# Patient Record
Sex: Female | Born: 1935 | Race: White | Hispanic: No | Smoking: Former smoker
Health system: Southern US, Community
[De-identification: ages and names within clinical notes are randomized; demographics above are authoritative.]

## PROBLEM LIST (undated history)

## (undated) DIAGNOSIS — I10 Essential (primary) hypertension: Secondary | ICD-10-CM

## (undated) DIAGNOSIS — F0280 Dementia in other diseases classified elsewhere without behavioral disturbance: Secondary | ICD-10-CM

## (undated) DIAGNOSIS — G309 Alzheimer's disease, unspecified: Secondary | ICD-10-CM

## (undated) DIAGNOSIS — J449 Chronic obstructive pulmonary disease, unspecified: Secondary | ICD-10-CM

---

## 2011-02-15 DEATH — deceased

## 2017-07-12 ENCOUNTER — Other Ambulatory Visit: Payer: Self-pay

## 2017-07-12 ENCOUNTER — Emergency Department (HOSPITAL_COMMUNITY): Payer: Medicare Other

## 2017-07-12 ENCOUNTER — Encounter (HOSPITAL_COMMUNITY): Payer: Self-pay

## 2017-07-12 ENCOUNTER — Emergency Department (HOSPITAL_COMMUNITY)
Admission: EM | Admit: 2017-07-12 | Discharge: 2017-07-12 | Disposition: A | Discharge status: Skilled Nursing Facility | Payer: Medicare Other | Attending: Emergency Medicine | Admitting: Emergency Medicine

## 2017-07-12 DIAGNOSIS — J449 Chronic obstructive pulmonary disease, unspecified: Secondary | ICD-10-CM | POA: Insufficient documentation

## 2017-07-12 DIAGNOSIS — R001 Bradycardia, unspecified: Secondary | ICD-10-CM | POA: Diagnosis not present

## 2017-07-12 DIAGNOSIS — G309 Alzheimer's disease, unspecified: Secondary | ICD-10-CM | POA: Diagnosis not present

## 2017-07-12 DIAGNOSIS — Z87891 Personal history of nicotine dependence: Secondary | ICD-10-CM | POA: Insufficient documentation

## 2017-07-12 DIAGNOSIS — R05 Cough: Secondary | ICD-10-CM | POA: Diagnosis present

## 2017-07-12 DIAGNOSIS — I10 Essential (primary) hypertension: Secondary | ICD-10-CM | POA: Insufficient documentation

## 2017-07-12 DIAGNOSIS — R630 Anorexia: Secondary | ICD-10-CM | POA: Insufficient documentation

## 2017-07-12 HISTORY — DX: Dementia in other diseases classified elsewhere, unspecified severity, without behavioral disturbance, psychotic disturbance, mood disturbance, and anxiety: F02.80

## 2017-07-12 HISTORY — DX: Chronic obstructive pulmonary disease, unspecified: J44.9

## 2017-07-12 HISTORY — DX: Alzheimer's disease, unspecified: G30.9

## 2017-07-12 HISTORY — DX: Essential (primary) hypertension: I10

## 2017-07-12 LAB — URINALYSIS, ROUTINE W REFLEX MICROSCOPIC
Bacteria, UA: NONE SEEN
Bilirubin Urine: NEGATIVE
GLUCOSE, UA: NEGATIVE mg/dL
HGB URINE DIPSTICK: NEGATIVE
KETONES UR: NEGATIVE mg/dL
Leukocytes, UA: NEGATIVE
Nitrite: NEGATIVE
PROTEIN: 100 mg/dL — AB
Specific Gravity, Urine: 1.021 (ref 1.005–1.030)
pH: 6 (ref 5.0–8.0)

## 2017-07-12 LAB — COMPREHENSIVE METABOLIC PANEL
ALT: 9 U/L — AB (ref 14–54)
AST: 26 U/L (ref 15–41)
Albumin: 3.3 g/dL — ABNORMAL LOW (ref 3.5–5.0)
Alkaline Phosphatase: 48 U/L (ref 38–126)
Anion gap: 10 (ref 5–15)
BUN: 46 mg/dL — AB (ref 6–20)
CHLORIDE: 102 mmol/L (ref 101–111)
CO2: 31 mmol/L (ref 22–32)
CREATININE: 1.7 mg/dL — AB (ref 0.44–1.00)
Calcium: 9.7 mg/dL (ref 8.9–10.3)
GFR calc Af Amer: 29 mL/min — ABNORMAL LOW (ref >60)
GFR calc non Af Amer: 25 mL/min — ABNORMAL LOW (ref >60)
GLUCOSE: 124 mg/dL — AB (ref 65–99)
POTASSIUM: 4 mmol/L (ref 3.5–5.1)
SODIUM: 143 mmol/L (ref 135–145)
Total Bilirubin: 0.6 mg/dL (ref 0.3–1.2)
Total Protein: 6.4 g/dL — ABNORMAL LOW (ref 6.5–8.1)

## 2017-07-12 LAB — CBC WITH DIFFERENTIAL/PLATELET
BASOS ABS: 0 10*3/uL (ref 0.0–0.1)
BASOS PCT: 0 %
EOS ABS: 0.2 10*3/uL (ref 0.0–0.7)
EOS PCT: 2 %
HCT: 40.6 % (ref 36.0–46.0)
Hemoglobin: 13.2 g/dL (ref 12.0–15.0)
LYMPHS PCT: 18 %
Lymphs Abs: 2.3 10*3/uL (ref 0.7–4.0)
MCH: 29.9 pg (ref 26.0–34.0)
MCHC: 32.5 g/dL (ref 30.0–36.0)
MCV: 92.1 fL (ref 78.0–100.0)
Monocytes Absolute: 1.3 10*3/uL — ABNORMAL HIGH (ref 0.1–1.0)
Monocytes Relative: 10 %
Neutro Abs: 9.2 10*3/uL — ABNORMAL HIGH (ref 1.7–7.7)
Neutrophils Relative %: 70 %
PLATELETS: 265 10*3/uL (ref 150–400)
RBC: 4.41 MIL/uL (ref 3.87–5.11)
RDW: 13.6 % (ref 11.5–15.5)
WBC: 13.1 10*3/uL — AB (ref 4.0–10.5)

## 2017-07-12 LAB — I-STAT TROPONIN, ED: TROPONIN I, POC: 0 ng/mL (ref 0.00–0.08)

## 2017-07-12 MED ORDER — SODIUM CHLORIDE 0.9 % IV BOLUS
500.0000 mL | Freq: Once | INTRAVENOUS | Status: AC
  Administered 2017-07-12: 500 mL via INTRAVENOUS

## 2017-07-12 NOTE — ED Notes (Signed)
Bed: MB86 Expected date:  Expected time:  Means of arrival:  Comments: 84 yo poor PO intake x2 days

## 2017-07-12 NOTE — ED Triage Notes (Signed)
Per EMS- Patient is a resident of [facility] on [address] . Staff reported that the patient has had a decreased appetite x 2 days. Staff reported that the patient has ate only 30-40% when she normally eats 80%.

## 2017-07-12 NOTE — ED Provider Notes (Signed)
[facility] DEPT Provider Note   CSN: [id number] Arrival date & time: 07/12/17  1032     History   Chief Complaint Chief Complaint  Patient presents with  . decreased appetite  . Cough    HPI Priscilla Ramirez is a 84 y.o. female.  Patient is a 84 year old female with a history of hypertension, COPD and dementia who lives at assisted living in presenting today due to increased decreased appetite.  Granddaughter is present with the patient and states last week she had a fever and flulike illness and completed 5-day course of Tamiflu and a 7-day course of Levaquin.  Levaquin finished 4 days ago.  Patient has not had a persistent wet sounding cough that does not produce any sputum and yesterday and today noted to have a decreased appetite.  Patient has only 820-30% of her meal when she normally eats 80%.  When questioning the patient she states she did not care for the food that they served.  She is denying chest pain, shortness of breath, abdominal pain, nausea, vomiting or fever currently.  The history is provided by the patient.  Cough  This is a new problem. Episode onset: 1 week. The problem occurs constantly. The problem has not changed since onset.The cough is non-productive. Maximum temperature: 1 day of fever 1 week ago but no presistent fever. Pertinent negatives include no chest pain, no chills, no headaches, no shortness of breath and no wheezing.    Past Medical History:  Diagnosis Date  . Alzheimer's dementia   . COPD (chronic obstructive pulmonary disease) ([place])   . Hypertension     There are no active problems to display for this patient.   History reviewed. No pertinent surgical history.   OB History   None      Home Medications    Prior to Admission medications   Not on File    Family History No family history on file.  Social History Social History   Tobacco Use  . Smoking status: Former Smoker    Types: Cigarettes    . Smokeless tobacco: Never Used  Substance Use Topics  . Alcohol use: Never    Frequency: Never  . Drug use: Not on file     Allergies   Patient has no known allergies.   Review of Systems Review of Systems  Constitutional: Negative for chills.  Respiratory: Positive for cough. Negative for shortness of breath and wheezing.   Cardiovascular: Negative for chest pain.  Neurological: Negative for headaches.  All other systems reviewed and are negative.    Physical Exam Updated Vital Signs BP (!) 164/56 (BP Location: Left Arm)   Pulse (!) 55   Temp 97.9 F (36.6 C) (Oral)   Resp 16   Ht 5' (1.524 m)   Wt 56.7 kg (125 lb)   SpO2 96%   BMI 24.41 kg/m   Physical Exam  Constitutional: She is oriented to person, place, and time. She appears well-developed and well-nourished. No distress.  HENT:  Head: Normocephalic and atraumatic.  Mouth/Throat: Oropharynx is clear and moist. Mucous membranes are dry.  Eyes: Pupils are equal, round, and reactive to light. Conjunctivae and EOM are normal.  Neck: Normal range of motion. Neck supple.  Cardiovascular: Normal rate, regular rhythm and intact distal pulses.  No murmur heard. Pulmonary/Chest: Effort normal. No respiratory distress. She has no wheezes. She has rhonchi in the left lower field. She has no rales.  Cough that is nonproductive  Abdominal: Soft.  She exhibits no distension. There is no tenderness. There is no rebound and no guarding.  Musculoskeletal: Normal range of motion. She exhibits no edema or tenderness.  Neurological: She is alert and oriented to person, place, and time.  Skin: Skin is warm and dry. No rash noted. No erythema.  Psychiatric: She has a normal mood and affect. Her behavior is normal.  Nursing note and vitals reviewed.    ED Treatments / Results  Labs (all labs ordered are listed, but only abnormal results are displayed) Labs Reviewed  CBC WITH DIFFERENTIAL/PLATELET - Abnormal; Notable for the  following components:      Result Value   WBC 13.1 (*)    Neutro Abs 9.2 (*)    Monocytes Absolute 1.3 (*)    All other components within normal limits  COMPREHENSIVE METABOLIC PANEL - Abnormal; Notable for the following components:   Glucose, Bld 124 (*)    BUN 46 (*)    Creatinine, Ser 1.70 (*)    Total Protein 6.4 (*)    Albumin 3.3 (*)    ALT 9 (*)    GFR calc non Af Amer 25 (*)    GFR calc Af Amer 29 (*)    All other components within normal limits  URINALYSIS, ROUTINE W REFLEX MICROSCOPIC - Abnormal; Notable for the following components:   Protein, ur 100 (*)    Squamous Epithelial / LPF 0-5 (*)    All other components within normal limits  I-STAT TROPONIN, ED    EKG EKG Interpretation  Date/Time:  Friday July 12 2017 14:03:56 EDT Ventricular Rate:  66 PR Interval:    QRS Duration: 130 QT Interval:  514 QTC Calculation: 539 R Axis:   -63 Text Interpretation:  Sinus rhythm RBBB and LAFB No previous tracing Confirmed by Blanchie Dessert [phone]) on 07/12/2017 2:17:02 PM   Radiology Dg Chest 2 View  Result Date: 07/12/2017 CLINICAL DATA:  Cough EXAM: CHEST - 2 VIEW COMPARISON:  None. FINDINGS: Normal heart size. Aortic atherosclerosis. Normal mediastinal contour. No pneumothorax. No pleural effusion. No pulmonary edema. No acute consolidative airspace disease. Minimal scarring versus atelectasis at the left lung base. IMPRESSION: Minimal scarring versus atelectasis at the left lung base. Otherwise no active cardiopulmonary disease. Electronically Signed   By: Ilona Sorrel M.D.   On: 07/12/2017 12:05    Procedures Procedures (including critical care time)  Medications Ordered in ED Medications  sodium chloride 0.9 % bolus 500 mL (0 mLs Intravenous Stopped 07/12/17 1355)     Initial Impression / Assessment and Plan / ED Course  I have reviewed the triage vital signs and the nursing notes.  Pertinent labs & imaging results that were available during my care of the  patient were reviewed by me and considered in my medical decision making (see chart for details).     Elderly female presenting today with decreased appetite for the last 2 days.  She was recently treated with Tamiflu and Levaquin last week for cough and flulike symptoms.  Per family member the cough has persisted the patient has no complaints.  She denies feeling short of breath and states that she just did not like what they served and that is why she did not eat.  Patient's vital signs are reassuring.  She does have some mild rhonchi in the left lower lobe in the setting of cough we will do an x-ray to ensure no evidence of pneumonia.  Lower suspicion for atypical ACS.  Will do EKG to  ensure no cardiac issues.  Also will check a CBC, BMP to ensure no electrolyte or renal dysfunction.  Patient given 500 mL's of fluid.  4:49 PM Labs with mild leukocytosis of 13,000, negative troponin, EKG without acute findings, CMP with possible mild renal insufficiency with a creatinine of 1.7 and a GFR of 25 liver this may be chronic as there are no old to compare.  Chest x-ray without acute findings and UA without sign of infection.  Patient is otherwise been stable.  She does have episodes of bradycardia however these are asymptomatic.  Patient and family states this is been going on for years.  Recommended following up with cardiologist in the future.  However at this time feel she is safe for discharge home.  Final Clinical Impressions(s) / ED Diagnoses   Final diagnoses:  Poor appetite  Bradycardia    ED Discharge Orders    None       Blanchie Dessert, MD 07/12/17 1650

## 2017-07-12 NOTE — ED Notes (Signed)
Purewick removed.

## 2017-07-12 NOTE — ED Notes (Signed)
WICK PUT IN PLACE FOR URINE COLLECTION

## 2017-07-12 NOTE — ED Notes (Signed)
Signature pad not working. 

## 2017-07-16 ENCOUNTER — Encounter (HOSPITAL_COMMUNITY): Payer: Self-pay

## 2017-07-16 ENCOUNTER — Inpatient Hospital Stay (HOSPITAL_COMMUNITY)
Admission: EM | Admit: 2017-07-16 | Discharge: 2017-07-19 | DRG: 545 | Disposition: A | Discharge status: Skilled Nursing Facility | Payer: Medicare Other | Attending: Internal Medicine | Admitting: Internal Medicine

## 2017-07-16 ENCOUNTER — Emergency Department (HOSPITAL_COMMUNITY): Payer: Medicare Other

## 2017-07-16 ENCOUNTER — Other Ambulatory Visit: Payer: Self-pay

## 2017-07-16 ENCOUNTER — Emergency Department (HOSPITAL_COMMUNITY)
Admit: 2017-07-16 | Discharge: 2017-07-16 | Disposition: A | Discharge status: Home / Self Care | Payer: Medicare Other | Attending: Emergency Medicine | Admitting: Emergency Medicine

## 2017-07-16 DIAGNOSIS — R531 Weakness: Secondary | ICD-10-CM

## 2017-07-16 DIAGNOSIS — N184 Chronic kidney disease, stage 4 (severe): Secondary | ICD-10-CM | POA: Diagnosis not present

## 2017-07-16 DIAGNOSIS — Z66 Do not resuscitate: Secondary | ICD-10-CM | POA: Diagnosis present

## 2017-07-16 DIAGNOSIS — N183 Chronic kidney disease, stage 3 unspecified: Secondary | ICD-10-CM | POA: Diagnosis present

## 2017-07-16 DIAGNOSIS — I10 Essential (primary) hypertension: Secondary | ICD-10-CM

## 2017-07-16 DIAGNOSIS — M7989 Other specified soft tissue disorders: Secondary | ICD-10-CM | POA: Diagnosis not present

## 2017-07-16 DIAGNOSIS — J9601 Acute respiratory failure with hypoxia: Secondary | ICD-10-CM | POA: Diagnosis present

## 2017-07-16 DIAGNOSIS — M02361 Reiter's disease, right knee: Secondary | ICD-10-CM | POA: Diagnosis present

## 2017-07-16 DIAGNOSIS — M79609 Pain in unspecified limb: Secondary | ICD-10-CM

## 2017-07-16 DIAGNOSIS — E860 Dehydration: Secondary | ICD-10-CM | POA: Diagnosis present

## 2017-07-16 DIAGNOSIS — M25561 Pain in right knee: Secondary | ICD-10-CM

## 2017-07-16 DIAGNOSIS — T447X5A Adverse effect of beta-adrenoreceptor antagonists, initial encounter: Secondary | ICD-10-CM | POA: Diagnosis present

## 2017-07-16 DIAGNOSIS — G309 Alzheimer's disease, unspecified: Secondary | ICD-10-CM | POA: Diagnosis present

## 2017-07-16 DIAGNOSIS — R5381 Other malaise: Secondary | ICD-10-CM

## 2017-07-16 DIAGNOSIS — R001 Bradycardia, unspecified: Secondary | ICD-10-CM | POA: Diagnosis present

## 2017-07-16 DIAGNOSIS — Z87891 Personal history of nicotine dependence: Secondary | ICD-10-CM | POA: Diagnosis not present

## 2017-07-16 DIAGNOSIS — M109 Gout, unspecified: Secondary | ICD-10-CM | POA: Diagnosis present

## 2017-07-16 DIAGNOSIS — I4581 Long QT syndrome: Secondary | ICD-10-CM | POA: Diagnosis present

## 2017-07-16 DIAGNOSIS — Z22322 Carrier or suspected carrier of Methicillin resistant Staphylococcus aureus: Secondary | ICD-10-CM | POA: Diagnosis not present

## 2017-07-16 DIAGNOSIS — F419 Anxiety disorder, unspecified: Secondary | ICD-10-CM | POA: Diagnosis present

## 2017-07-16 DIAGNOSIS — M25461 Effusion, right knee: Secondary | ICD-10-CM

## 2017-07-16 DIAGNOSIS — N179 Acute kidney failure, unspecified: Secondary | ICD-10-CM | POA: Diagnosis not present

## 2017-07-16 DIAGNOSIS — T441X5A Adverse effect of other parasympathomimetics [cholinergics], initial encounter: Secondary | ICD-10-CM | POA: Diagnosis present

## 2017-07-16 DIAGNOSIS — F0280 Dementia in other diseases classified elsewhere without behavioral disturbance: Secondary | ICD-10-CM

## 2017-07-16 DIAGNOSIS — I4510 Unspecified right bundle-branch block: Secondary | ICD-10-CM | POA: Diagnosis present

## 2017-07-16 DIAGNOSIS — J449 Chronic obstructive pulmonary disease, unspecified: Secondary | ICD-10-CM | POA: Diagnosis not present

## 2017-07-16 DIAGNOSIS — F0390 Unspecified dementia without behavioral disturbance: Secondary | ICD-10-CM

## 2017-07-16 DIAGNOSIS — I129 Hypertensive chronic kidney disease with stage 1 through stage 4 chronic kidney disease, or unspecified chronic kidney disease: Secondary | ICD-10-CM | POA: Diagnosis present

## 2017-07-16 DIAGNOSIS — D72829 Elevated white blood cell count, unspecified: Secondary | ICD-10-CM | POA: Insufficient documentation

## 2017-07-16 LAB — COMPREHENSIVE METABOLIC PANEL
ALBUMIN: 3.1 g/dL — AB (ref 3.5–5.0)
ALT: 18 U/L (ref 14–54)
AST: 49 U/L — AB (ref 15–41)
Alkaline Phosphatase: 57 U/L (ref 38–126)
Anion gap: 11 (ref 5–15)
BUN: 40 mg/dL — AB (ref 6–20)
CHLORIDE: 105 mmol/L (ref 101–111)
CO2: 26 mmol/L (ref 22–32)
Calcium: 9.4 mg/dL (ref 8.9–10.3)
Creatinine, Ser: 1.61 mg/dL — ABNORMAL HIGH (ref 0.44–1.00)
GFR calc Af Amer: 30 mL/min — ABNORMAL LOW (ref >60)
GFR calc non Af Amer: 26 mL/min — ABNORMAL LOW (ref >60)
GLUCOSE: 106 mg/dL — AB (ref 65–99)
POTASSIUM: 4.4 mmol/L (ref 3.5–5.1)
Sodium: 142 mmol/L (ref 135–145)
Total Bilirubin: 1.1 mg/dL (ref 0.3–1.2)
Total Protein: 6.9 g/dL (ref 6.5–8.1)

## 2017-07-16 LAB — CBC WITH DIFFERENTIAL/PLATELET
Basophils Absolute: 0 10*3/uL (ref 0.0–0.1)
Basophils Relative: 0 %
EOS PCT: 0 %
Eosinophils Absolute: 0.1 10*3/uL (ref 0.0–0.7)
HCT: 41.4 % (ref 36.0–46.0)
Hemoglobin: 13.3 g/dL (ref 12.0–15.0)
LYMPHS ABS: 1.6 10*3/uL (ref 0.7–4.0)
Lymphocytes Relative: 10 %
MCH: 29.5 pg (ref 26.0–34.0)
MCHC: 32.1 g/dL (ref 30.0–36.0)
MCV: 91.8 fL (ref 78.0–100.0)
MONO ABS: 1.2 10*3/uL (ref 0.1–1.0)
MONOS PCT: 8 %
Neutro Abs: 13.2 10*3/uL (ref 1.7–7.7)
Neutrophils Relative %: 82 %
Platelets: 269 10*3/uL (ref 150–400)
RBC: 4.51 MIL/uL (ref 3.87–5.11)
RDW: 14.3 % (ref 11.5–15.5)
WBC: 16.1 10*3/uL — ABNORMAL HIGH (ref 4.0–10.5)

## 2017-07-16 LAB — URINALYSIS, ROUTINE W REFLEX MICROSCOPIC
BACTERIA UA: NONE SEEN
Bilirubin Urine: NEGATIVE
GLUCOSE, UA: NEGATIVE mg/dL
Hgb urine dipstick: NEGATIVE
KETONES UR: NEGATIVE mg/dL
Leukocytes, UA: NEGATIVE
NITRITE: NEGATIVE
PROTEIN: 100 mg/dL — AB
Specific Gravity, Urine: 1.019 (ref 1.005–1.030)
pH: 5 (ref 5.0–8.0)

## 2017-07-16 LAB — I-STAT TROPONIN, ED: Troponin i, poc: 0 ng/mL (ref 0.00–0.08)

## 2017-07-16 MED ORDER — LORATADINE 10 MG PO TABS
10.0000 mg | ORAL_TABLET | Freq: Every day | ORAL | Status: DC
  Administered 2017-07-17 – 2017-07-19 (×3): 10 mg via ORAL
  Filled 2017-07-16 (×4): qty 1

## 2017-07-16 MED ORDER — OXYCODONE-ACETAMINOPHEN 5-325 MG PO TABS: 1.0000 | ORAL_TABLET | ORAL | Status: DC | PRN

## 2017-07-16 MED ORDER — ACETAMINOPHEN 325 MG PO TABS: 650.0000 mg | ORAL_TABLET | Freq: Four times a day (QID) | ORAL | Status: DC | PRN

## 2017-07-16 MED ORDER — IPRATROPIUM-ALBUTEROL 0.5-2.5 (3) MG/3ML IN SOLN
3.0000 mL | RESPIRATORY_TRACT | Status: DC
  Administered 2017-07-17: 3 mL via RESPIRATORY_TRACT
  Filled 2017-07-16: qty 3

## 2017-07-16 MED ORDER — ACETAMINOPHEN 650 MG RE SUPP: 650.0000 mg | Freq: Four times a day (QID) | RECTAL | Status: DC | PRN

## 2017-07-16 MED ORDER — SODIUM CHLORIDE 0.9 % IV SOLN
INTRAVENOUS | Status: DC
  Administered 2017-07-16: via INTRAVENOUS
  Administered 2017-07-17: 75 mL/h via INTRAVENOUS
  Administered 2017-07-18 – 2017-07-19 (×2): via INTRAVENOUS

## 2017-07-16 MED ORDER — DONEPEZIL HCL 10 MG PO TABS
10.0000 mg | ORAL_TABLET | Freq: Every day | ORAL | Status: DC
  Administered 2017-07-17 – 2017-07-18 (×2): 10 mg via ORAL
  Filled 2017-07-16 (×3): qty 1

## 2017-07-16 MED ORDER — CALCIUM CARBONATE-VITAMIN D 500-200 MG-UNIT PO TABS
1.0000 | ORAL_TABLET | Freq: Two times a day (BID) | ORAL | Status: DC
  Administered 2017-07-17 – 2017-07-19 (×6): 1 via ORAL
  Filled 2017-07-16 (×5): qty 1

## 2017-07-16 MED ORDER — DM-GUAIFENESIN ER 30-600 MG PO TB12
1.0000 | ORAL_TABLET | Freq: Two times a day (BID) | ORAL | Status: DC | PRN
Start: 2017-07-16 — End: 2017-07-19

## 2017-07-16 MED ORDER — HYDRALAZINE HCL 20 MG/ML IJ SOLN: 5.0000 mg | INTRAMUSCULAR | Status: DC | PRN

## 2017-07-16 MED ORDER — HYDRALAZINE HCL 25 MG PO TABS
25.0000 mg | ORAL_TABLET | Freq: Two times a day (BID) | ORAL | Status: DC
  Administered 2017-07-17 – 2017-07-19 (×6): 25 mg via ORAL
  Filled 2017-07-16 (×6): qty 1

## 2017-07-16 MED ORDER — SENNOSIDES-DOCUSATE SODIUM 8.6-50 MG PO TABS: 1.0000 | ORAL_TABLET | Freq: Every evening | ORAL | Status: DC | PRN

## 2017-07-16 MED ORDER — TRAZODONE HCL 50 MG PO TABS
50.0000 mg | ORAL_TABLET | Freq: Every day | ORAL | Status: DC
  Administered 2017-07-17 – 2017-07-18 (×3): 50 mg via ORAL
  Filled 2017-07-16 (×3): qty 1

## 2017-07-16 MED ORDER — VANCOMYCIN HCL IN DEXTROSE 750-5 MG/150ML-% IV SOLN
750.0000 mg | Freq: Once | INTRAVENOUS | Status: AC
  Administered 2017-07-17: 750 mg via INTRAVENOUS
  Filled 2017-07-16: qty 150

## 2017-07-16 MED ORDER — LORAZEPAM 0.5 MG PO TABS
0.5000 mg | ORAL_TABLET | Freq: Two times a day (BID) | ORAL | Status: DC
  Administered 2017-07-17 – 2017-07-19 (×6): 0.5 mg via ORAL
  Filled 2017-07-16 (×6): qty 1

## 2017-07-16 MED ORDER — HYDROXYZINE HCL 10 MG PO TABS
10.0000 mg | ORAL_TABLET | Freq: Three times a day (TID) | ORAL | Status: DC | PRN
  Filled 2017-07-16: qty 1

## 2017-07-16 MED ORDER — ALBUTEROL SULFATE (2.5 MG/3ML) 0.083% IN NEBU
2.5000 mg | INHALATION_SOLUTION | RESPIRATORY_TRACT | Status: DC | PRN
  Administered 2017-07-19: 2.5 mg via RESPIRATORY_TRACT
  Filled 2017-07-16: qty 3

## 2017-07-16 MED ORDER — SODIUM CHLORIDE 0.9 % IV BOLUS
1000.0000 mL | Freq: Once | INTRAVENOUS | Status: AC
  Administered 2017-07-16: 1000 mL via INTRAVENOUS

## 2017-07-16 NOTE — Progress Notes (Signed)
Right lower extremity venous duplex completed. There is no evidence of a DVT,superficial thrombosis, or Baker's cyst, There is a large area on fliud which is not vascularized in the popliteal fossa.

## 2017-07-16 NOTE — ED Triage Notes (Signed)
Pt arrived via [facility], per EMS, pt from [facility]; called out for increased wkndess  And bilateral lower extremity edema. EMS noted rapid rate change from 70-32; Atropine 0.5mg  administered and rate increased to 40s, pt has hx of dementia; 18RFA; 176/58

## 2017-07-16 NOTE — ED Notes (Signed)
Patient transported to CT 

## 2017-07-16 NOTE — ED Provider Notes (Signed)
[facility] EMERGENCY DEPARTMENT Provider Note   CSN: [id number] Arrival date & time: 07/16/17  1621     History   Chief Complaint Chief Complaint  Patient presents with  . Bradycardia    HPI Priscilla Ramirez is a 84 y.o. female with a hx of Alzheimer's, HTN, and COPD who arrives to the ED via EMS from [facility] for RLE swelling and increased generalized weakness and decreased PO intake. - Patient provided hx limited secondary to dementia- consistently answers no to all questions. Only complaint at this time is that she is cold.  - Hx provided by EMS- EMS called to assisted living facility for RLE swelling and generalized weakness/fatigue. Upon arrival patient with initial HR of 70 and abrupt decrease to 32, 0.5mg  Atropine given with improvement of HR into the 40s.  - Hx provided by [facility]- I called and spoke with clinical staff. Patient has had decreased appetite, fatigue, and disinterest in participating in activities over the past 2 week. She was seen in the ED 03/29 for similar and discharged home after receiving IV fluids. Patient has had persistence of these sxs.Additionally she is having RLE pain/swelling over past couple of days. Staff has not noted any complaints of headaches, numbness, focal weakness, chest pain, or dyspnea, she has not appeared short of breath at the nursing home. No recent changes in medications. Patient is baseline oriented to person, disoriented to place/time. She has appeared at her mental baseline per the clinical staff.   Level 5 Caveat Secondary to Dementia at baseline.     HPI  Past Medical History:  Diagnosis Date  . Alzheimer's dementia   . COPD (chronic obstructive pulmonary disease) ([place])   . Hypertension     There are no active problems to display for this patient.   History reviewed. No pertinent surgical history.   OB History   None      Home Medications    Prior to  Admission medications   Medication Sig Start Date End Date Taking? Authorizing Provider  Calcium 600-400 MG-UNIT CHEW Chew 1 tablet by mouth 2 (two) times daily.    [provider]  cetirizine (ZYRTEC) 10 MG tablet Take 10 mg by mouth daily.    [provider]  donepezil (ARICEPT) 10 MG tablet Take 10 mg by mouth at bedtime.    [provider]  furosemide (LASIX) 20 MG tablet Take 20 mg by mouth daily. FOR 3 DAYS    [provider]  guaiFENesin (ROBITUSSIN MUCUS+CHEST CONGEST) 100 MG/5ML liquid Take 400 mg by mouth 4 (four) times daily as needed for cough or congestion.    [provider]  hydrALAZINE (APRESOLINE) 25 MG tablet Take 25 mg by mouth 2 (two) times daily.    [provider]  levofloxacin (LEVAQUIN) 500 MG tablet Take 500 mg by mouth at bedtime. FOR 7 DAYS    [provider]  LORazepam (ATIVAN) 0.5 MG tablet Take 0.5 mg by mouth 2 (two) times daily.    [provider]  metoprolol tartrate (LOPRESSOR) 100 MG tablet Take 100 mg by mouth 2 (two) times daily.    [provider]  oseltamivir (TAMIFLU) 75 MG capsule Take 45 mg by mouth 2 (two) times daily.    [provider]  saccharomyces boulardii (FLORASTOR) 250 MG capsule Take 250 mg by mouth 2 (two) times daily.    [provider]  traZODone (DESYREL) 50 MG tablet Take 50 mg by  mouth at bedtime.    [provider]    Family History History reviewed. No pertinent family history.  Social History Social History   Tobacco Use  . Smoking status: Former Smoker    Types: Cigarettes  . Smokeless tobacco: Never Used  Substance Use Topics  . Alcohol use: Never    Frequency: Never  . Drug use: Not on file     Allergies   Patient has no known allergies.   Review of Systems Review of Systems  Unable to perform ROS: Dementia  Constitutional: Positive for activity change and appetite change.  Cardiovascular: Positive for leg  swelling (RLE).  Musculoskeletal: Positive for myalgias (RLE).  Neurological: Positive for weakness (generalized).    Physical Exam Updated Vital Signs BP (!) 161/47 (BP Location: Left Arm)   Pulse (!) 44   Temp 98.2 F (36.8 C) (Oral)   Resp (!) 21   SpO2 (!) 86%   Physical Exam  Constitutional:  Non-toxic appearance. No distress.  HENT:  Mouth/Throat: Mucous membranes are dry.  Eyes: Pupils are equal, round, and reactive to light.  Cardiovascular: Regular rhythm. Bradycardia present.  No murmur heard. Pulses:      Dorsalis pedis pulses are 2+ on the right side, and 2+ on the left side.  Pulmonary/Chest: Effort normal. No respiratory distress. She has decreased breath sounds (throughout). She has no wheezes.  Abdominal: Soft. There is no tenderness. There is no rigidity and no guarding.  Musculoskeletal:  Lower extremities: Right lower extremity with 2+ pitting edema to the knee.  Patient with limited range of motion of all joints, unclear if this is secondary to pain or weakness.  RLE lower leg tender to palpation. There is no overlying erythema or warmth.   Neurological:  Alert.  Oriented to person.  Disoriented to time and place.   Skin: Skin is warm and dry.   ED Treatments / Results  Labs Results for orders placed or performed during the hospital encounter of 07/16/17  Comprehensive metabolic panel  Result Value Ref Range   Sodium 142 135 - 145 mmol/L   Potassium 4.4 3.5 - 5.1 mmol/L   Chloride 105 101 - 111 mmol/L   CO2 26 22 - 32 mmol/L   Glucose, Bld 106 (H) 65 - 99 mg/dL   BUN 40 (H) 6 - 20 mg/dL   Creatinine, Ser 1.61 (H) 0.44 - 1.00 mg/dL   Calcium 9.4 8.9 - 10.3 mg/dL   Total Protein 6.9 6.5 - 8.1 g/dL   Albumin 3.1 (L) 3.5 - 5.0 g/dL   AST 49 (H) 15 - 41 U/L   ALT 18 14 - 54 U/L   Alkaline Phosphatase 57 38 - 126 U/L   Total Bilirubin 1.1 0.3 - 1.2 mg/dL   GFR calc non Af Amer 26 (L) >60 mL/min   GFR calc Af Amer 30 (L) >60 mL/min   Anion gap 11 5 -  15  CBC with Differential  Result Value Ref Range   WBC 16.1 (H) 4.0 - 10.5 K/uL   RBC 4.51 3.87 - 5.11 MIL/uL   Hemoglobin 13.3 12.0 - 15.0 g/dL   HCT 41.4 36.0 - 46.0 %   MCV 91.8 78.0 - 100.0 fL   MCH 29.5 26.0 - 34.0 pg   MCHC 32.1 30.0 - 36.0 g/dL   RDW 14.3 11.5 - 15.5 %   Platelets 269 150 - 400 K/uL   Neutrophils Relative % 82 %   Neutro Abs 13.2 1.7 - 7.7  K/uL   Lymphocytes Relative 10 %   Lymphs Abs 1.6 0.7 - 4.0 K/uL   Monocytes Relative 8 %   Monocytes Absolute 1.2 0.1 - 1.0 K/uL   Eosinophils Relative 0 %   Eosinophils Absolute 0.1 0.0 - 0.7 K/uL   Basophils Relative 0 %   Basophils Absolute 0.0 0.0 - 0.1 K/uL  Urinalysis, Routine w reflex microscopic  Result Value Ref Range   Color, Urine YELLOW YELLOW   APPearance CLEAR CLEAR   Specific Gravity, Urine 1.019 1.005 - 1.030   pH 5.0 5.0 - 8.0   Glucose, UA NEGATIVE NEGATIVE mg/dL   Hgb urine dipstick NEGATIVE NEGATIVE   Bilirubin Urine NEGATIVE NEGATIVE   Ketones, ur NEGATIVE NEGATIVE mg/dL   Protein, ur 100 (A) NEGATIVE mg/dL   Nitrite NEGATIVE NEGATIVE   Leukocytes, UA NEGATIVE NEGATIVE   RBC / HPF 0-5 0 - 5 RBC/hpf   WBC, UA 0-5 0 - 5 WBC/hpf   Bacteria, UA NONE SEEN NONE SEEN   Squamous Epithelial / LPF 0-5 (A) NONE SEEN   Mucus PRESENT    Hyaline Casts, UA PRESENT   I-stat troponin, ED  Result Value Ref Range   Troponin i, poc 0.00 0.00 - 0.08 ng/mL   Comment 3            EKG EKG Interpretation  Date/Time:  Tuesday July 16 2017 16:29:51 EDT Ventricular Rate:  44 PR Interval:    QRS Duration: 129 QT Interval:  650 QTC Calculation: 557 R Axis:   -60 Text Interpretation:  Sinus bradycardia RBBB and LAFB Confirmed by Milton Ferguson [phone]) on 07/16/2017 9:43:53 PM   Radiology Dg Pelvis 1-2 Views  Result Date: 07/16/2017 CLINICAL DATA:  Fall EXAM: PELVIS - 1-2 VIEW COMPARISON:  None. FINDINGS: Scoliosis and degenerative changes of the lower lumbar spine. Mild SI joint degenerative changes.  Pubic symphysis and rami are intact. Both femoral heads project in joint. No definite acute displaced fracture is seen. There are vascular calcifications. IMPRESSION: Slightly limited evaluation of femoral necks due to superimposition of the trochanters. No definite acute osseous abnormality is seen Electronically Signed   By: Donavan Foil M.D.   On: 07/16/2017 21:36   Dg Tibia/fibula Right  Result Date: 07/16/2017 CLINICAL DATA:  Fall EXAM: RIGHT TIBIA AND FIBULA - 2 VIEW COMPARISON:  None. FINDINGS: No fracture or malalignment is seen. Vascular calcifications. Small plantar calcaneal spur. Chondrocalcinosis at the knee. IMPRESSION: Soft tissue swelling. No acute osseous abnormality. Chondrocalcinosis at the knee. Electronically Signed   By: Donavan Foil M.D.   On: 07/16/2017 21:37   Ct Head Wo Contrast  Result Date: 07/16/2017 CLINICAL DATA:  Altered mental status. EXAM: CT HEAD WITHOUT CONTRAST TECHNIQUE: Contiguous axial images were obtained from the base of the skull through the vertex without intravenous contrast. COMPARISON:  None. FINDINGS: Brain: Enlargement of the lateral and third ventricles out of proportion to the degree of sulcal enlargement. Crowding of the gyri at the vertex. Prominent periventricular and subcortical white matter hypodensities are nonspecific. No evidence of acute infarction, hemorrhage, extra-axial collection or mass lesion/mass effect. Vascular: Calcified atherosclerosis at the skullbase. No hyperdense vessel. Skull: Negative for fracture or focal lesion. Sinuses/Orbits: No acute finding. Other: None. IMPRESSION: 1. Enlargement of the lateral and third ventricles out of proportion to the degree of sulcal enlargement/cortical atrophy, suspicious for normal pressure hydrocephalus. Electronically Signed   By: Titus Dubin M.D.   On: 07/16/2017 18:12   Dg Chest [facility]  Result Date: 07/16/2017 CLINICAL DATA:  Increasing weakness EXAM: PORTABLE CHEST 1 VIEW COMPARISON:   07/12/2017 FINDINGS: Cardiac shadow is stable. Aortic calcifications are again seen. The lungs are well aerated bilaterally without focal infiltrate or sizable effusion. No acute bony abnormality is noted. IMPRESSION: No active disease. Electronically Signed   By: Inez Catalina M.D.   On: 07/16/2017 19:07    Procedures Procedures (including critical care time)  Medications Ordered in ED Medications  sodium chloride 0.9 % bolus 1,000 mL (1,000 mLs Intravenous New Bag/Given 07/16/17 2148)    Initial Impression / Assessment and Plan / ED Course  I have reviewed the triage vital signs and the nursing notes.  Pertinent labs & imaging results that were available during my care of the patient were reviewed by me and considered in my medical decision making (see chart for details).   Patient presents with generalized weakness/fatigue/decreased PO intake x 2 weeks and RLE swelling/pain for past few days. Patient resting comfortably upon initial evaluation. She is noted to be bradycardic and hypertensive. Exam notable for RLE pitting edema and tenderness to palpation. She is NVI distally. Given progressive weakness will evaluate with labs, CXR, head CT and EKG. Will evaluate RLE with [place] for DVT and X-rays for acute fracture or dislocation.   Labs appear fairly consistent with previous ED visit.  She has a nonspecific leukocytosis at 16.1. Her renal function appears without significant change. Her UA is without signs of infection. Troponin WNL. CXR negative. Head CT with findings suspicious for normal pressure hydrocephalus- discussed with supervising physician Dr. Roderic Palau, who suspects this is not acute.   RLE [place] negative for DVT, X-rays negative for fracture/dislocation. RLE is not erythematous or warm, patient is not febrile, do not suspect cellulitis at this time. Unclear etiology to RLE pain/swelling at this time.  Will plan for admission for dehydration and progressive weakness and decreased PO intake.  Findings and plan of care discussed with supervising physician Dr. Roderic Palau who personally evaluated and examined this patient and is in agreement.    22:02: CONSULT: Discussed case with hospitalist Dr. Blaine Hamper who accepts admission.   Final Clinical Impressions(s) / ED Diagnoses   Final diagnoses:  Weakness  Bradycardia    ED Discharge Orders    None       Amaryllis Dyke, PA-C 07/16/17 2235    Milton Ferguson, MD 07/17/17 1010

## 2017-07-16 NOTE — H&P (Signed)
History and Physical    Priscilla Ramirez QJF:[id number] DOB: 01/17/1921 DOA: 07/16/2017  Referring MD/NP/PA:   PCP: Merlene Laughter, MD   Patient coming from:  The patient is coming from [facility] living facility.  At baseline, pt is dependent for most of ADL.  Chief Complaint: right knee pain and swelling, right leg swelling, generalized weakness, bradycarida  HPI: Priscilla Ramirez is a 84 y.o. female with medical history significant of hypertension, COPD, Alzheimer's disease, anxiety, CKD-3, who presents with right knee pain and swelling, and right leg swelling, generalized weakness, bradycardia.  Per her son, pt has been having generalized weakness for more than 2 weeks. Patient also has decreased oral intake. She does not have unilateral weaknee or numbness in extremities. No vision change or hearing loss. She has right knee pain and mild swelling recently. Her right knee is mildly and erythematous. No knee injury. Her right lower leg is swelling. She has dry cough and mild SOB, no CP. Patient denies nausea, vomiting, diarrhea, abdominal pain. No symptoms of UTI. Per EMS report, patient had initial HR of 70 and abrupt decrease to 32, 0.5mg  Atropine was given with improvement of HR into the 40s. Currently her HR is 40s.   ED Course: pt was found to have WBC 16.1, negative troponin,negative urinalysis, stable renal function, bradycardia, oxygen saturation to 86% on room air, which improved to 100% on 1 L nasal cannula oxygen, negative chest x-ray. Patient is admitted to telemetry as inpt.  Review of Systems:   General: no fevers, chills, no body weight gain, has poor appetite, has fatigue HEENT: no blurry vision, hearing changes or sore throat Respiratory: has dyspnea, coughing, no wheezing CV: no chest pain, no palpitations GI: no nausea, vomiting, abdominal pain, diarrhea, constipation GU: no dysuria, burning on urination, increased urinary frequency, hematuria  Ext: has right lower leg  edema Neuro: no unilateral weakness, numbness, or tingling, no vision change or hearing loss Skin: no rash, no skin tear. MSK: has right knee pain and swelling Heme: No easy bruising.  Travel history: No recent long distant travel.  Allergy: No Known Allergies  Past Medical History:  Diagnosis Date  . Alzheimer's dementia   . COPD (chronic obstructive pulmonary disease) ([place])   . Hypertension     History reviewed. No pertinent surgical history.  Social History:  reports that she has quit smoking. Her smoking use included cigarettes. She has never used smokeless tobacco. She reports that she has current or past drug history. She reports that she does not drink alcohol.  Family History:  Family History  Problem Relation Age of Onset  . Heart attack Mother   . Cancer Father      Prior to Admission medications   Medication Sig Start Date End Date Taking? Authorizing Provider  Calcium 600-400 MG-UNIT CHEW Chew 1 tablet by mouth 2 (two) times daily.   Yes [provider]  cetirizine (ZYRTEC) 10 MG tablet Take 10 mg by mouth at bedtime.    Yes [provider]  donepezil (ARICEPT) 10 MG tablet Take 10 mg by mouth at bedtime.   Yes [provider]  hydrALAZINE (APRESOLINE) 25 MG tablet Take 25 mg by mouth 2 (two) times daily.   Yes [provider]  LORazepam (ATIVAN) 0.5 MG tablet Take 0.5 mg by mouth 2 (two) times daily.   Yes [provider]  metoprolol tartrate (LOPRESSOR) 100 MG tablet Take 100 mg by mouth 2 (two) times daily.   Yes [provider]  traZODone (DESYREL) 50 MG tablet Take 50 mg by mouth at bedtime.   Yes [provider]    Physical Exam: Vitals:   07/17/17 0030 07/17/17 0100 07/17/17 0116 07/17/17 0122  BP: (!) 101/58  (!) 144/69 (!) 144/69  Pulse: (!) 44 (!) 44 (!) 47   Resp: 17 14 14    Temp:      TempSrc:      SpO2: 94% 93% 94%    General: Not in acute distress HEENT:       Eyes: PERRL, EOMI,  no scleral icterus.       ENT: No discharge from the ears and nose, no pharynx injection, no tonsillar enlargement.        Neck: No JVD, no bruit, no mass felt. Heme: No neck lymph node enlargement. Cardiac: S1/S2, RRR, No murmurs, No gallops or rubs. Respiratory:  No rales, wheezing, rhonchi or rubs. GI: Soft, nondistended, nontender, no rebound pain, no organomegaly, BS present. GU: No hematuria Ext: has 1+ pitting right leg edema.  2+DP/PT pulse bilaterally. Musculoskeletal: right knee is tender, mildly swollen, mildly erythematous and warm. Skin: No rashes.  Neuro: Alert, oriented X3, cranial nerves II-XII grossly intact, moves all extremities normally. Psych: Patient is not psychotic, no suicidal or hemocidal ideation.  Labs on Admission: I have personally reviewed following labs and imaging studies  CBC: Recent Labs  Lab 07/12/17 1139 07/16/17 1750  WBC 13.1* 16.1*  NEUTROABS 9.2* 13.2  HGB 13.2 13.3  HCT 40.6 41.4  MCV 92.1 91.8  PLT 265 678   Basic Metabolic Panel: Recent Labs  Lab 07/12/17 1139 07/16/17 1750  NA 143 142  K 4.0 4.4  CL 102 105  CO2 31 26  GLUCOSE 124* 106*  BUN 46* 40*  CREATININE 1.70* 1.61*  CALCIUM 9.7 9.4   GFR: Estimated Creatinine Clearance: 16.9 mL/min (A) (by C-G formula based on SCr of 1.61 mg/dL (H)). Liver Function Tests: Recent Labs  Lab 07/12/17 1139 07/16/17 1750  AST 26 49*  ALT 9* 18  ALKPHOS 48 57  BILITOT 0.6 1.1  PROT 6.4* 6.9  ALBUMIN 3.3* 3.1*   No results for input(s): LIPASE, AMYLASE in the last 168 hours. No results for input(s): AMMONIA in the last 168 hours. Coagulation Profile: No results for input(s): INR, PROTIME in the last 168 hours. Cardiac Enzymes: Recent Labs  Lab 07/16/17 2359  TROPONINI 0.03*   BNP (last 3 results) No results for input(s): PROBNP in the last 8760 hours. HbA1C: No results for input(s): HGBA1C in the last 72 hours. CBG: No results for input(s): GLUCAP in the last 168  hours. Lipid Profile: No results for input(s): CHOL, HDL, LDLCALC, TRIG, CHOLHDL, LDLDIRECT in the last 72 hours. Thyroid Function Tests: No results for input(s): TSH, T4TOTAL, FREET4, T3FREE, THYROIDAB in the last 72 hours. Anemia Panel: No results for input(s): VITAMINB12, FOLATE, FERRITIN, TIBC, IRON, RETICCTPCT in the last 72 hours. Urine analysis:    Component Value Date/Time   COLORURINE YELLOW 07/16/2017 2105   APPEARANCEUR CLEAR 07/16/2017 2105   LABSPEC 1.019 07/16/2017 2105   PHURINE 5.0 07/16/2017 2105   GLUCOSEU NEGATIVE 07/16/2017 2105   HGBUR NEGATIVE 07/16/2017 2105   BILIRUBINUR NEGATIVE 07/16/2017 2105   [place] NEGATIVE 07/16/2017 2105   PROTEINUR 100 (A) 07/16/2017 2105   NITRITE NEGATIVE 07/16/2017 2105   LEUKOCYTESUR NEGATIVE 07/16/2017 2105   Sepsis Labs: @LABRCNTIP (procalcitonin:4,lacticidven:4) )No results found for this or any previous visit (from the past 240 hour(s)).   Radiological Exams on  Admission: Dg Pelvis 1-2 Views  Result Date: 07/16/2017 CLINICAL DATA:  Fall EXAM: PELVIS - 1-2 VIEW COMPARISON:  None. FINDINGS: Scoliosis and degenerative changes of the lower lumbar spine. Mild SI joint degenerative changes. Pubic symphysis and rami are intact. Both femoral heads project in joint. No definite acute displaced fracture is seen. There are vascular calcifications. IMPRESSION: Slightly limited evaluation of femoral necks due to superimposition of the trochanters. No definite acute osseous abnormality is seen Electronically Signed   By: Donavan Foil M.D.   On: 07/16/2017 21:36   Dg Tibia/fibula Right  Result Date: 07/16/2017 CLINICAL DATA:  Fall EXAM: RIGHT TIBIA AND FIBULA - 2 VIEW COMPARISON:  None. FINDINGS: No fracture or malalignment is seen. Vascular calcifications. Small plantar calcaneal spur. Chondrocalcinosis at the knee. IMPRESSION: Soft tissue swelling. No acute osseous abnormality. Chondrocalcinosis at the knee. Electronically Signed   By:  Donavan Foil M.D.   On: 07/16/2017 21:37   Ct Head Wo Contrast  Result Date: 07/16/2017 CLINICAL DATA:  Altered mental status. EXAM: CT HEAD WITHOUT CONTRAST TECHNIQUE: Contiguous axial images were obtained from the base of the skull through the vertex without intravenous contrast. COMPARISON:  None. FINDINGS: Brain: Enlargement of the lateral and third ventricles out of proportion to the degree of sulcal enlargement. Crowding of the gyri at the vertex. Prominent periventricular and subcortical white matter hypodensities are nonspecific. No evidence of acute infarction, hemorrhage, extra-axial collection or mass lesion/mass effect. Vascular: Calcified atherosclerosis at the skullbase. No hyperdense vessel. Skull: Negative for fracture or focal lesion. Sinuses/Orbits: No acute finding. Other: None. IMPRESSION: 1. Enlargement of the lateral and third ventricles out of proportion to the degree of sulcal enlargement/cortical atrophy, suspicious for normal pressure hydrocephalus. Electronically Signed   By: Titus Dubin M.D.   On: 07/16/2017 18:12   Dg Chest Port 1 View  Result Date: 07/16/2017 CLINICAL DATA:  Increasing weakness EXAM: PORTABLE CHEST 1 VIEW COMPARISON:  07/12/2017 FINDINGS: Cardiac shadow is stable. Aortic calcifications are again seen. The lungs are well aerated bilaterally without focal infiltrate or sizable effusion. No acute bony abnormality is noted. IMPRESSION: No active disease. Electronically Signed   By: Inez Catalina M.D.   On: 07/16/2017 19:07     EKG: Independently reviewed.  Sinus rhythm, QTC 557, LAD,  Low voltage, bifascicular block    Assessment/Plan Principal Problem:   Right knee pain Active Problems:   COPD (chronic obstructive pulmonary disease) (H[facility])   Hypertension   Alzheimer's dementia   Right leg swelling   CKD (chronic kidney disease), stage III ([facility])   Bradycardia   Physical deconditioning   Anxiety   Right knee pain and swelling: X-ray of R knee  was ordered, which was canceled since I was called by Radiology, and told me that previous x-ray of tibia/fibula included the knee joint, which did not show knee joint bony fracture. Etiology for right knee pain and swelling is not clear. Her uric acid is elevated 7.6, indicating gout is possible. Since patient has leukocytosis, cannot rule out possibility of septic joint. Will treat both empirically now.  -will admit to tele bed as inpt -start IV vancomycin -f/u Bx -prn percocet for pain -give one dose of Solu-Medrol 40 mg 1, then followed by 10 mg prednisone daily -please call ortho in AM for possible arthrocentesis in AM.  Right leg swelling: Etiology is not clear. Lower extremity venous Doppler is negative for DVT, superficial thrombosis, or Baker's cyst. however it showed a large area on fliud which  is not vascularized in the popliteal fossa-->not sure about implication. -Elevation of right leg -check BNP  Bradycardia: likely due to metoprolol use. Patient is taking metoprolol 100 mg twice a day. HR is 40s  now. Hemodynamics are stable. -d/c metoprolol -if HR drops again or becomes unstable-->will treat with glucagon.  COPD (chronic obstructive pulmonary disease) ([place]): pt has oxygen desaturation on room air, but no wheezing or rhonchi on auscultation. Chest x-rays negative -DuoNeb nebulizers, when necessary albuterol nebulizer -prn ucinex for cough  HTN:  -Continue home medications: hydralazine -IV hydralazine prn  Alzheimer's dementia: -donepezil  CKD (chronic kidney disease), stage III ([place]): Recent creatinine was 1.703/29/19. Her creatinine is 1.61, BUN 40, stable. Follow-up renal function by BMP  Physical deconditioning: -pt/ot -nutrition consult  Anxiety:  -continue home ativan   DVT ppx: SCD Code Status: DNR (I discussed with patient in the presence of her son who is the POA, and explained the meaning of CODE STATUS. Patient wants to be DNR). Family  Communication: Yes, patient's son at bed side Disposition Plan:  Anticipate discharge back to previous Asst. Living facility Consults called:  none Admission status:   Inpatient/tele        Date of Service 07/17/2017    Ivor Costa [facility] Pager [phone]  If 7PM-7AM, please contact night-coverage [email or web address] Password [email or web address] 07/17/2017, 2:41 AM

## 2017-07-16 NOTE — ED Notes (Signed)
Pt placed on 2L [place] as O2 sats decreased to 70s

## 2017-07-17 ENCOUNTER — Inpatient Hospital Stay (HOSPITAL_COMMUNITY): Payer: Medicare Other

## 2017-07-17 DIAGNOSIS — M25461 Effusion, right knee: Secondary | ICD-10-CM

## 2017-07-17 DIAGNOSIS — N184 Chronic kidney disease, stage 4 (severe): Secondary | ICD-10-CM | POA: Diagnosis present

## 2017-07-17 LAB — TROPONIN I
TROPONIN I: 0.03 ng/mL — AB (ref <0.03)
Troponin I: 0.03 ng/mL (ref <0.03)

## 2017-07-17 LAB — PROTIME-INR
INR: 1.15
PROTHROMBIN TIME: 14.7 s (ref 11.4–15.2)

## 2017-07-17 LAB — BRAIN NATRIURETIC PEPTIDE: B NATRIURETIC PEPTIDE 5: 141.7 pg/mL — AB (ref 0.0–100.0)

## 2017-07-17 LAB — SEDIMENTATION RATE: Sed Rate: 27 mm/hr — ABNORMAL HIGH (ref 0–22)

## 2017-07-17 LAB — LACTIC ACID, PLASMA: LACTIC ACID, VENOUS: 1.3 mmol/L (ref 0.5–1.9)

## 2017-07-17 LAB — URIC ACID: URIC ACID, SERUM: 7.6 mg/dL — AB (ref 2.3–6.6)

## 2017-07-17 MED ORDER — METHYLPREDNISOLONE SODIUM SUCC 40 MG IJ SOLR
40.0000 mg | Freq: Once | INTRAMUSCULAR | Status: AC
  Filled 2017-07-17: qty 1

## 2017-07-17 MED ORDER — PREDNISONE 5 MG PO TABS
10.0000 mg | ORAL_TABLET | Freq: Every day | ORAL | Status: DC
  Administered 2017-07-18 – 2017-07-19 (×2): 10 mg via ORAL
  Filled 2017-07-17 (×3): qty 2

## 2017-07-17 MED ORDER — METHYLPREDNISOLONE SODIUM SUCC 40 MG IJ SOLR
40.0000 mg | Freq: Once | INTRAMUSCULAR | Status: AC
  Administered 2017-07-17: 40 mg via INTRAVENOUS

## 2017-07-17 MED ORDER — ENSURE ENLIVE PO LIQD
237.0000 mL | Freq: Two times a day (BID) | ORAL | Status: DC
  Administered 2017-07-17 – 2017-07-19 (×4): 237 mL via ORAL

## 2017-07-17 MED ORDER — IPRATROPIUM-ALBUTEROL 0.5-2.5 (3) MG/3ML IN SOLN
3.0000 mL | Freq: Two times a day (BID) | RESPIRATORY_TRACT | Status: DC
  Administered 2017-07-17 – 2017-07-19 (×4): 3 mL via RESPIRATORY_TRACT
  Filled 2017-07-17 (×4): qty 3

## 2017-07-17 MED ORDER — VANCOMYCIN HCL IN DEXTROSE 750-5 MG/150ML-% IV SOLN: 750.0000 mg | INTRAVENOUS | Status: DC

## 2017-07-17 MED ORDER — IPRATROPIUM-ALBUTEROL 0.5-2.5 (3) MG/3ML IN SOLN
3.0000 mL | Freq: Three times a day (TID) | RESPIRATORY_TRACT | Status: DC
  Administered 2017-07-17: 3 mL via RESPIRATORY_TRACT
  Filled 2017-07-17: qty 3

## 2017-07-17 NOTE — Progress Notes (Signed)
Initial Nutrition Assessment  DOCUMENTATION CODES:   Not applicable  INTERVENTION:  Provide Ensure Enlive po BID, each supplement provides 350 kcal and 20 grams of protein.  Encourage adequate PO intake.   NUTRITION DIAGNOSIS:   Increased nutrient needs related to chronic illness(COPD) as evidenced by estimated needs.  GOAL:   Patient will meet greater than or equal to 90% of their needs  MONITOR:   PO intake, Supplement acceptance, Labs, Weight trends, I & O's, Skin  REASON FOR ASSESSMENT:   Consult Assessment of nutrition requirement/status  ASSESSMENT:   84 y.o. female with a PMH of hypertension, COPD, Alzheimer's disease, anxiety, and stage IIIc KD who was admitted 07/16/17 for evaluation of right knee pain and right leg swelling associated with generalized weakness and bradycardia.  Pt unavailable during time of visit. RD unable to obtain nutrition history. Meal completion has been 50%. RD to order nutritional supplements to aid in adequate caloric and protein needs.   Unable to complete Nutrition-Focused physical exam at this time.   Labs and medications reviewed.   Diet Order:  Diet Heart Room service appropriate? Yes; Fluid consistency: Thin  EDUCATION NEEDS:   Not appropriate for education at this time  Skin:  Skin Assessment: Reviewed RN Assessment  Last BM:  PTA  Height:   Ht Readings from Last 1 Encounters:  07/12/17 5' (1.524 m)    Weight:   Wt Readings from Last 1 Encounters:  07/12/17 125 lb (56.7 kg)    Ideal Body Weight:  45.45 kg  BMI:  There is no height or weight on file to calculate BMI.  Estimated Nutritional Needs:   Kcal:  1400-1600  Protein:  55-65 grams  Fluid:  >/= 1.5 L/day    Priscilla Parker, MS, RD, LDN Pager # [phone] After hours/ weekend pager # [phone]

## 2017-07-17 NOTE — Evaluation (Signed)
Occupational Therapy Evaluation Patient Details Name: Priscilla Ramirez MRN: [id number] DOB: [birth year] Today's Date: 07/17/2017    History of Present Illness 84 y.o. female with medical history significant of hypertension, COPD, Alzheimer's disease, anxiety, CKD-3, who presents with right knee pain and swelling, and right leg swelling, generalized weakness, bradycardia. Pt presenting with right knee pain and swelling. Dopplers negative for DVT. MRI showing medial meniscus tear of RLE.        Clinical Impression   PTA, pt was living at [facility] and was performing transfers to/from w/c with supervision and requiring assistance for BADLs including dressing and bathing. Pt currently requiring Mod-Max A for sitting balance at EOB during grooming task. Requiring Max A +2 for bed mobility and pt limited by fatigue, lethargy, and RLE pain. Pt would benefit from further acute OT to facilitate safe dc. Recommend dc to SNF for further OT to optimize safety, independence with ADLs, and return to PLOF.      Follow Up Recommendations  SNF    Equipment Recommendations  Other (comment)(Defer to next venue)    Recommendations for Other Services PT consult     Precautions / Restrictions Precautions Precautions: Fall;Other (comment)(Left knee pain) Restrictions Weight Bearing Restrictions: No      Mobility Bed Mobility Overal bed mobility: Needs Assistance Bed Mobility: Supine to Sit;Sit to Supine     Supine to sit: Max assist;+2 for physical assistance Sit to supine: Max assist;+2 for physical assistance   General bed mobility comments: Max A to bring BLEs to EOB and use of bed pad to transition hips. Pt requiring Max A to elevate trunk. Max A +2 to return to supine  Transfers                 General transfer comment: Not attempted due to pain    Balance Overall balance assessment: Needs assistance Sitting-balance support: Feet supported;Bilateral upper extremity  supported Sitting balance-Leahy Scale: Poor Sitting balance - Comments: Reliant on physical A to maintain upright posture                                   ADL either performed or assessed with clinical judgement   ADL Overall ADL's : Needs assistance/impaired     Grooming: Oral care;Wash/dry face;Moderate assistance;Maximal assistance;Sitting;Cueing for sequencing Grooming Details (indicate cue type and reason): Pt performing oral care and washing her face at EOB with intermittent physial A for sitting upright. Pt requiring cues leaning forward to correct posterior. Pt leaning on left elbow while performing oral care but continues to have posterior lean.  Upper Body Bathing: Moderate assistance;Bed level   Lower Body Bathing: Total assistance;Bed level   Upper Body Dressing : Moderate assistance;Bed level   Lower Body Dressing: Total assistance;Bed level Lower Body Dressing Details (indicate cue type and reason): donned socks in bed               General ADL Comments: Pt performing grooming at EOB. Pt with decreased arousal and benefits from bed mobility to wake up. Pt demonstrating increased fatigue and poor sitting balance     Vision Baseline Vision/History: Wears glasses Wears Glasses: At all times Patient Visual Report: No change from baseline       Perception     Praxis      Pertinent Vitals/Pain Pain Assessment: Faces Faces Pain Scale: Hurts even more Pain Location: Right knee Pain Descriptors / Indicators: Constant;Discomfort;Grimacing Pain Intervention(s):  Monitored during session;Limited activity within patient's tolerance;Repositioned     Hand Dominance Right   Extremity/Trunk Assessment Upper Extremity Assessment Upper Extremity Assessment: Generalized weakness   Lower Extremity Assessment Lower Extremity Assessment: Generalized weakness   Cervical / Trunk Assessment Cervical / Trunk Assessment: Kyphotic   Communication  Communication Communication: No difficulties   Cognition Arousal/Alertness: Lethargic Behavior During Therapy: WFL for tasks assessed/performed Overall Cognitive Status: History of cognitive impairments - at baseline                                 General Comments: Pt asleep upon arrival. Waking up with bed mobility to EOB. Once transitioned to supine, pt returned to sleep. Pt demonstrating decreased problem solving and requiring increased cues and time. [place] cognitive deficits   General Comments  Son present throughout session    Exercises     Shoulder Instructions      Home Living Family/patient expects to be discharged to:: Assisted living                             Home Equipment: Wheelchair - manual;Walker - 2 wheels   Additional Comments: [place]      Prior Functioning/Environment Level of Independence: Needs assistance  Gait / Transfers Assistance Needed: Stand pivots to and from w/c with supervision ADL's / Homemaking Assistance Needed: assistance for dressing and bathing            OT Problem List: Decreased strength;Decreased range of motion;Decreased activity tolerance;Impaired balance (sitting and/or standing);Decreased cognition;Decreased safety awareness;Decreased knowledge of use of DME or AE;Pain      OT Treatment/Interventions: Self-care/ADL training;Therapeutic exercise;Energy conservation;DME and/or AE instruction;Therapeutic activities;Patient/family education    OT Goals(Current goals can be found in the care plan section) Acute Rehab OT Goals Patient Stated Goal: Family wanting her to return to PLOF OT Goal Formulation: With family Time For Goal Achievement: 07/31/17 Potential to Achieve Goals: Good ADL Goals Pt Will Perform Grooming: with set-up;with supervision;sitting Pt Will Perform Upper Body Dressing: sitting;with min assist Pt Will Transfer to Toilet: with mod assist;stand pivot transfer;bedside  commode Additional ADL Goal #1: Pt will perform bed mobility with Min A +2 in preparation for ADLs Additional ADL Goal #2: Pt will maintain sitting upright with Min Guard A for 8-10 minutes in preparation for ADLs  OT Frequency: Min 2X/week   Barriers to D/C:            Co-evaluation              AM-PAC PT "6 Clicks" Daily Activity     Outcome Measure Help from another person eating meals?: A Little Help from another person taking care of personal grooming?: A Lot Help from another person toileting, which includes using toliet, bedpan, or urinal?: A Lot Help from another person bathing (including washing, rinsing, drying)?: A Lot Help from another person to put on and taking off regular upper body clothing?: A Lot Help from another person to put on and taking off regular lower body clothing?: Total 6 Click Score: 12   End of Session Equipment Utilized During Treatment: Oxygen(2L) Nurse Communication: Mobility status  Activity Tolerance: Patient limited by lethargy;Patient limited by fatigue Patient left: in bed;with call bell/phone within reach;with nursing/sitter in room  OT Visit Diagnosis: Unsteadiness on feet (R26.81);Other abnormalities of gait and mobility (R26.89);Muscle weakness (generalized) (M62.81);Other symptoms and signs involving cognitive function;Pain  Pain - Right/Left: Right Pain - part of body: Knee                Time: 7322-0254 OT Time Calculation (min): 24 min Charges:  OT General Charges $OT Visit: 1 Visit OT Evaluation $OT Eval Moderate Complexity: 1 Mod G-Codes:     [place] MSOT, OTR/L Acute Rehab Pager: [phone] Office: [place] 07/17/2017, 3:59 PM

## 2017-07-17 NOTE — Evaluation (Signed)
Physical Therapy Evaluation Patient Details Name: Priscilla Ramirez MRN: [id number] DOB: 04/30/1920 Today's Date: 07/17/2017   History of Present Illness  84 y.o. female with medical history significant of hypertension, COPD, Alzheimer's disease, anxiety, CKD-3, who presents with right knee pain and swelling, and right leg swelling, generalized weakness, bradycardia. Pt presenting with right knee pain and swelling. Dopplers negative for DVT. MRI showing medial meniscus tear of RLE.   Clinical Impression  Patient presents with decreased independence with mobility due to pain, weakness and limited activity tolerance.  Feel she will benefit from skilled PT in the acute setting to allow return to ALF following STSNF level rehab stay.  Previously was min A at most for transfers, currently +2 A for supine to sit due to lethargy and weakness.     Follow Up Recommendations SNF;Supervision/Assistance - 24 hour    Equipment Recommendations  None recommended by PT    Recommendations for Other Services       Precautions / Restrictions Precautions Precautions: Fall;Other (comment)(L knee pain) Restrictions Weight Bearing Restrictions: No      Mobility  Bed Mobility Overal bed mobility: Needs Assistance Bed Mobility: Supine to Sit;Sit to Supine     Supine to sit: Max assist;+2 for physical assistance Sit to supine: Max assist;+2 for physical assistance   General bed mobility comments: Max A to bring BLEs to EOB and use of bed pad to transition hips. Pt requiring Max A to elevate trunk. Max A +2 to return to supine  Transfers                 General transfer comment: Not attempted due to pain  Ambulation/Gait                Stairs            Wheelchair Mobility    Modified Rankin (Stroke Patients Only)       Balance Overall balance assessment: Needs assistance Sitting-balance support: Feet supported;Bilateral upper extremity supported Sitting balance-Leahy Scale:  Poor Sitting balance - Comments: Reliant on physical A to maintain upright posture; flucuated from minguard to max A for balance; sitting to brush teeth at EOB Postural control: Posterior lean;Left lateral lean                                   Pertinent Vitals/Pain Pain Assessment: Faces Faces Pain Scale: Hurts even more Pain Location: Right knee with movement Pain Descriptors / Indicators: Constant;Discomfort;Grimacing Pain Intervention(s): Limited activity within patient's tolerance;Repositioned;Monitored during session    Home Living Family/patient expects to be discharged to:: Assisted living               Home Equipment: Wheelchair - [employer] - 2 wheels Additional Comments: [place]    Prior Function Level of Independence: Needs assistance   Gait / Transfers Assistance Needed: Stand pivots to and from w/c with supervision  ADL's / Homemaking Assistance Needed: assistance for dressing and bathing        Hand Dominance   Dominant Hand: Right    Extremity/Trunk Assessment   Upper Extremity Assessment Upper Extremity Assessment: Generalized weakness    Lower Extremity Assessment Lower Extremity Assessment: RLE deficits/detail;Generalized weakness RLE Deficits / Details: painful with manipulation of R LE for supine<>sit, noted some edema in lateral aspect and generally in joint area, no errythema and maybe mild warmth RLE: Unable to fully assess due to pain    Cervical /  Trunk Assessment Cervical / Trunk Assessment: Kyphotic  Communication   Communication: No difficulties  Cognition Arousal/Alertness: Lethargic Behavior During Therapy: WFL for tasks assessed/performed Overall Cognitive Status: History of cognitive impairments - at baseline                                 General Comments: Pt asleep upon arrival. Waking up with bed mobility to EOB. Once transitioned to supine, pt returned to sleep. Pt demonstrating decreased  problem solving and requiring increased cues and time. Basline cognitive deficits      General Comments General comments (skin integrity, edema, etc.): Son present and giving history/PLOF    Exercises     Assessment/Plan    PT Assessment Patient needs continued PT services  PT Problem List Decreased strength;Decreased mobility;Decreased activity tolerance;Decreased balance;Decreased knowledge of use of DME;Pain;Cardiopulmonary status limiting activity       PT Treatment Interventions DME instruction;Therapeutic activities;Patient/family education;Therapeutic exercise;Balance training;Functional mobility training;Wheelchair mobility training    PT Goals (Current goals can be found in the Care Plan section)  Acute Rehab PT Goals Patient Stated Goal: Family wanting her to return to PLOF PT Goal Formulation: With patient/family Time For Goal Achievement: 07/31/17 Potential to Achieve Goals: Fair    Frequency Min 2X/week   Barriers to discharge        Co-evaluation PT/OT/SLP Co-Evaluation/Treatment: Yes Reason for Co-Treatment: For patient/therapist safety PT goals addressed during session: Mobility/safety with mobility;Balance         AM-PAC PT "6 Clicks" Daily Activity  Outcome Measure Difficulty turning over in bed (including adjusting bedclothes, sheets and blankets)?: Unable Difficulty moving from lying on back to sitting on the side of the bed? : Unable Difficulty sitting down on and standing up from a chair with arms (e.g., wheelchair, bedside commode, etc,.)?: Unable Help needed moving to and from a bed to chair (including a wheelchair)?: Total Help needed walking in hospital room?: Total Help needed climbing 3-5 steps with a railing? : Total 6 Click Score: 6    End of Session   Activity Tolerance: Patient limited by pain Patient left: in bed;with call bell/phone within reach;with family/visitor present   PT Visit Diagnosis: Muscle weakness (generalized)  (M62.81);Difficulty in walking, not elsewhere classified (R26.2);Pain Pain - Right/Left: Right Pain - part of body: Knee    Time: 1515-1540 PT Time Calculation (min) (ACUTE ONLY): 25 min   Charges:   PT Evaluation $PT Eval Moderate Complexity: 1 Mod     PT G CodesMagda Kiel, [place] [phone] 07/17/2017   Reginia Naas 07/17/2017, 4:07 PM

## 2017-07-17 NOTE — Progress Notes (Signed)
Progress Note    Priscilla Ramirez  ESL:[id number] DOB: 1920/06/27  DOA: 07/16/2017 PCP: Merlene Laughter, MD    Brief Narrative:   Chief complaint: Follow-up generalized weakness and right leg pain/swelling  Medical records reviewed and are as summarized below:  Priscilla Ramirez is an 84 y.o. female with a PMH of hypertension, COPD, Alzheimer's disease, anxiety, and stage IIIc KD who was admitted 07/16/17 for evaluation of right knee pain and right leg swelling associated with generalized weakness and bradycardia. She has been weak 2 weeks and has not been eating well. Per EMS report, heart rate noted to abruptly decreased to 32 requiring 0.5 mg of atropine and upon arrival to the ED, her heart rate was in the 40s. WBC was 16.1 and she was mildly hypoxic with a saturation of 86% on room air which improved to 100% on oxygen via nasal cannula.  Assessment/Plan:   Principal Problems:   Right knee pain associated with RLE swelling Plain films done on admission unremarkable. Dopplers negative for DVT. Large area of fluid noted in the popliteal fossa. Uric acid elevated at 7.6. Placed on empiric vancomycin to cover the possibility of septic joint. Started on prednisone for possible gout flare. Upon exam this morning, I do not see any redness, though the entire right knee and thigh are swollen compared to the left. We'll obtain an MRI of the knee for better definition of what might be causing the swelling. Check an ESR.    Bradycardia Thought to be from metoprolol. At baseline, patient takes metoprolol 100 mg twice a day. Metoprolol held. Heart rate 47 on last check.  Active Problems:   Mild troponin elevation Troponin 0.03 but in the setting of stage IV CKD, likely is insignificant and the patient is not complaining of any chest pain or symptoms suggestive of acute coronary syndrome. Discontinue telemetry and further troponin checks. EKG personally reviewed and is negative for ischemic  changes.    COPD (chronic obstructive pulmonary disease) (HCC) associated with acute hypoxic respiratory failure Oxygen saturation improved on supplemental oxygen. No evidence of COPD exacerbation and chest x-ray was clear upon my personal review. Continue bronchodilators and supplemental oxygen.    Hypertension Continue hydralazine. Metoprolol on hold. Blood pressures reasonable.    Alzheimer's dementia Continue donepezil.    CKD (chronic kidney disease), stage IV (HCC)  Creatinine appears to be consistent with usual baseline values. GFR less than 20 consistent with stage IV disease. Will need very close monitoring of her kidney function while on vancomycin with daily creatinine checks.    Physical deconditioning PT evaluation.    Anxiety Continue Ativan as needed.  There is no height or weight on file to calculate BMI.   Family Communication/Anticipated D/C date and plan/Code Status   DVT prophylaxis: SCDs ordered. Code Status: DO NOT RESUSCITATE Family Communication: No family currently at bedside. Disposition Plan: The patient is from an ALF, will need further evaluation of the cause of her symptoms before she can be safely discharged.   Medical Consultants:    None   Anti-Infectives:    Vancomycin 07/16/17--->  Subjective:   Patient has advanced dementia and is unable to provide any details regarding the length of time she has been symptomatic or the quality of her pain. Does endorse that she is having some pain in the knee and denies systemic symptoms such as fever, body aches, nausea.  Objective:    Vitals:   07/17/17 0100 07/17/17 0116 07/17/17 0122 07/17/17 0403  BP:  (!) 144/69 (!) 144/69 (!) 125/29  Pulse: (!) 44 (!) 47    Resp: 14 14  18   Temp:    98 F (36.7 C)  TempSrc:    Oral  SpO2: 93% 94%  100%    Intake/Output Summary (Last 24 hours) at 07/17/2017 0739 Last data filed at 07/17/2017 0104 Gross per 24 hour  Intake 1150 ml  Output -  Net 1150  ml   There were no vitals filed for this visit.  Exam: General: No acute distress. Cardiovascular: Heart sounds show a regular rate, and rhythm. No gallops or rubs. No murmurs. No JVD. Lungs: Clear to auscultation bilaterally with good air movement. No rales, rhonchi or wheezes. Abdomen: Soft, nontender, nondistended with normal active bowel sounds. No masses. No hepatosplenomegaly. Neurological: Alert and oriented to self only. Moves all extremities 4 with diminished but equal strength. Cranial nerves II through XII grossly intact. Skin: Warm and dry. No rashes or lesions. Extremities: No clubbing or cyanosis. Right knee and thigh swollen compared to the left, but no erythema. Pedal pulses 2+. Psychiatric: Mood and affect are normal. Insight and judgment are impaired.   Data Reviewed:   I have personally reviewed following labs and imaging studies:  Labs: Labs show the following:   Basic Metabolic Panel: Recent Labs  Lab 07/12/17 1139 07/16/17 1750  NA 143 142  K 4.0 4.4  CL 102 105  CO2 31 26  GLUCOSE 124* 106*  BUN 46* 40*  CREATININE 1.70* 1.61*  CALCIUM 9.7 9.4   GFR Estimated Creatinine Clearance: 16.9 mL/min (A) (by C-G formula based on SCr of 1.61 mg/dL (H)). Liver Function Tests: Recent Labs  Lab 07/12/17 1139 07/16/17 1750  AST 26 49*  ALT 9* 18  ALKPHOS 48 57  BILITOT 0.6 1.1  PROT 6.4* 6.9  ALBUMIN 3.3* 3.1*   Coagulation profile Recent Labs  Lab 07/17/17 0540  INR 1.15    CBC: Recent Labs  Lab 07/12/17 1139 07/16/17 1750  WBC 13.1* 16.1*  NEUTROABS 9.2* 13.2  HGB 13.2 13.3  HCT 40.6 41.4  MCV 92.1 91.8  PLT 265 269   Cardiac Enzymes: Recent Labs  Lab 07/16/17 2359  TROPONINI 0.03*   Sepsis Labs: Recent Labs  Lab 07/12/17 1139 07/16/17 1750 07/16/17 2249  WBC 13.1* 16.1*  --   LATICACIDVEN  --   --  1.3    Microbiology No results found for this or any previous visit (from the past 240 hour(s)).  Procedures and  diagnostic studies:  Dg Pelvis 1-2 Views  Result Date: 07/16/2017 CLINICAL DATA:  Fall EXAM: PELVIS - 1-2 VIEW COMPARISON:  None. FINDINGS: Scoliosis and degenerative changes of the lower lumbar spine. Mild SI joint degenerative changes. Pubic symphysis and rami are intact. Both femoral heads project in joint. No definite acute displaced fracture is seen. There are vascular calcifications. IMPRESSION: Slightly limited evaluation of femoral necks due to superimposition of the trochanters. No definite acute osseous abnormality is seen Electronically Signed   By: Donavan Foil M.D.   On: 07/16/2017 21:36   Dg Tibia/fibula Right  Result Date: 07/16/2017 CLINICAL DATA:  Fall EXAM: RIGHT TIBIA AND FIBULA - 2 VIEW COMPARISON:  None. FINDINGS: No fracture or malalignment is seen. Vascular calcifications. Small plantar calcaneal spur. Chondrocalcinosis at the knee. IMPRESSION: Soft tissue swelling. No acute osseous abnormality. Chondrocalcinosis at the knee. Electronically Signed   By: Donavan Foil M.D.   On: 07/16/2017 21:37   Ct Head Wo Contrast  Result Date: 07/16/2017 CLINICAL DATA:  Altered mental status. EXAM: CT HEAD WITHOUT CONTRAST TECHNIQUE: Contiguous axial images were obtained from the base of the skull through the vertex without intravenous contrast. COMPARISON:  None. FINDINGS: Brain: Enlargement of the lateral and third ventricles out of proportion to the degree of sulcal enlargement. Crowding of the gyri at the vertex. Prominent periventricular and subcortical white matter hypodensities are nonspecific. No evidence of acute infarction, hemorrhage, extra-axial collection or mass lesion/mass effect. Vascular: Calcified atherosclerosis at the skullbase. No hyperdense vessel. Skull: Negative for fracture or focal lesion. Sinuses/Orbits: No acute finding. Other: None. IMPRESSION: 1. Enlargement of the lateral and third ventricles out of proportion to the degree of sulcal enlargement/cortical atrophy,  suspicious for normal pressure hydrocephalus. Electronically Signed   By: Titus Dubin M.D.   On: 07/16/2017 18:12   Dg Chest Port 1 View  Result Date: 07/16/2017 CLINICAL DATA:  Increasing weakness EXAM: PORTABLE CHEST 1 VIEW COMPARISON:  07/12/2017 FINDINGS: Cardiac shadow is stable. Aortic calcifications are again seen. The lungs are well aerated bilaterally without focal infiltrate or sizable effusion. No acute bony abnormality is noted. IMPRESSION: No active disease. Electronically Signed   By: Inez Catalina M.D.   On: 07/16/2017 19:07    Medications:   . calcium-vitamin D  1 tablet Oral BID WC  . donepezil  10 mg Oral QHS  . hydrALAZINE  25 mg Oral BID  . ipratropium-albuterol  3 mL Nebulization TID  . loratadine  10 mg Oral Daily  . LORazepam  0.5 mg Oral BID  . methylPREDNISolone (SOLU-MEDROL) injection  40 mg Intravenous Once  . predniSONE  10 mg Oral Q breakfast  . traZODone  50 mg Oral QHS   Continuous Infusions: . sodium chloride 75 mL/hr at 07/16/17 2336  . [START ON 07/18/2017] vancomycin       LOS: 1 day   Jacquelynn Cree  [facility] Pager [phone]. If unable to reach me by pager, please call my cell phone at [phone].  *Please refer to [email or web address], password [email or web address] to get updated schedule on who will round on this patient, as hospitalists switch teams weekly. If 7PM-7AM, please contact night-coverage at [email or web address], password [email or web address] for any overnight needs.  07/17/2017, 7:39 AM

## 2017-07-17 NOTE — Progress Notes (Signed)
Pharmacy Antibiotic Note  Priscilla Ramirez is a 84 y.o. female admitted on 07/16/2017 with right knee pain/swelling.  Pharmacy has been consulted for Vancomycin dosing cellulitis vs joint infection. WBC is elevated. Noted renal dysfunction.   Plan: Vancomycin 750 mg IV q48h Trend WBC, temp, renal function  F/U infectious work-up Drug levels as indicated  Temp (24hrs), Avg:98.2 F (36.8 C), Min:98.2 F (36.8 C), Max:98.2 F (36.8 C)  Recent Labs  Lab 07/12/17 1139 07/16/17 1750 07/16/17 2249  WBC 13.1* 16.1*  --   CREATININE 1.70* 1.61*  --   LATICACIDVEN  --   --  1.3    Estimated Creatinine Clearance: 16.9 mL/min (A) (by C-G formula based on SCr of 1.61 mg/dL (H)).    No Known Allergies   Priscilla Ramirez 07/17/2017 2:38 AM

## 2017-07-17 NOTE — Social Work (Signed)
CSW is from [place] (phone number [phone]), facility says that pt uses wheelchair (self propelled) at baseline and was completing transfers independently for the most part.   Facility following for pt recommendations and dispo plan.   CSW continuing to follow. [place], [place] Work [phone]

## 2017-07-18 DIAGNOSIS — N179 Acute kidney failure, unspecified: Secondary | ICD-10-CM

## 2017-07-18 LAB — BASIC METABOLIC PANEL
ANION GAP: 8 (ref 5–15)
BUN: 43 mg/dL — ABNORMAL HIGH (ref 6–20)
CO2: 21 mmol/L — ABNORMAL LOW (ref 22–32)
CREATININE: 1.27 mg/dL — AB (ref 0.44–1.00)
Calcium: 7.7 mg/dL — ABNORMAL LOW (ref 8.9–10.3)
Chloride: 112 mmol/L — ABNORMAL HIGH (ref 101–111)
GFR calc non Af Amer: 35 mL/min — ABNORMAL LOW (ref >60)
GFR, EST AFRICAN AMERICAN: 41 mL/min — AB (ref >60)
Glucose, Bld: 142 mg/dL — ABNORMAL HIGH (ref 65–99)
Potassium: 4 mmol/L (ref 3.5–5.1)
SODIUM: 141 mmol/L (ref 135–145)

## 2017-07-18 MED ORDER — NAPROXEN 250 MG PO TABS
500.0000 mg | ORAL_TABLET | Freq: Two times a day (BID) | ORAL | Status: DC
  Administered 2017-07-18 – 2017-07-19 (×2): 500 mg via ORAL
  Filled 2017-07-18 (×2): qty 2

## 2017-07-18 NOTE — Clinical Social Work Note (Signed)
Clinical Social Work Assessment  Patient Details  Name: Priscilla Ramirez MRN: [id number] Date of Birth: 05-13-1920  Date of referral:  07/18/17               Reason for consult:  Facility Placement, Discharge Planning, [employer]                Permission sought to share information with:  Facility Sport and exercise psychologist, Family Supports Permission granted to share information::  No(pt only oriented to self)  Name::      Jeneen Rinks Clair Gulling) [place]::     Relationship::   son  Contact Information:     Housing/Transportation Living arrangements for the past 2 months:  [place] of Information:  Adult Children Patient Interpreter Needed:  None Criminal Activity/Legal Involvement Pertinent to Current Situation/Hospitalization:  No - Comment as needed Significant Relationships:  Adult Children, Warehouse manager, Other Family Members Lives with:  Facility Resident Do you feel safe going back to the place where you live?  Yes Need for family participation in patient care:  Yes (Comment)(decision making)  Care giving concerns:  Pt is from [facility], has been there for a few years. Has advanced Alzheimers, only oriented to self. Was independently transferring with supervision but has been lately having more difficulty, SNF recommended.    Social Worker assessment / plan:  Due to pt orientation CSW spoke with pt son Jeneen Rinks Clair Gulling). He states that the pt has lived at [facility] for a period and this is her first time going to a SNF. He would like something close to him and his wife as he is pt only living child. CSW explained role and recommendations, son amenable and would like to see options. Aware pt may be discharged in 24-48 hours if stable. CSW continuing to follow.   Employment status:  Retired Forensic scientist:  [employer] PT Recommendations:  [place], [place] / Referral to community resources:  [place]  Patient/Family's Response to care:  Pt son states understanding of SNF recommendation and CSW role, appreciative of support.   Patient/Family's Understanding of and Emotional Response to Diagnosis, Current Treatment, and Prognosis:  Pt son states understanding of diagnosis, current treatemnt, and prognosis. Pt son has reasonable expectations and desires for his mother to return to ALF after SNF stay.  Emotional Assessment Appearance:  Appears stated age Attitude/Demeanor/Rapport:  Unable to Assess Affect (typically observed):  Unable to Assess(pt was sleeping; only oriented to self) Orientation:  Oriented to Self Alcohol / Substance use:  Not Applicable Psych involvement (Current and /or in the community):  No (Comment)  Discharge Needs  Concerns to be addressed:  Care Coordination Readmission within the last 30 days:  No Current discharge risk:  Physical Impairment, Dependent with Mobility, Cognitively Impaired Barriers to Discharge:  Continued Medical Work up, [employer], [place] 07/18/2017, 4:39 PM

## 2017-07-18 NOTE — NC FL2 (Addendum)
[place] LEVEL OF CARE SCREENING TOOL     IDENTIFICATION  Patient Name: Priscilla Ramirez Birthdate: 1920/11/01 Sex: female Admission Date (Current Location): 07/16/2017  [facility] and [place] Number:  Herbalist and Address:  [facility] [place], [place] [address], [place], [place] [address]      Provider Number: [id number]  Attending Physician Name and Address:  Debbe Odea, MD  Relative Name and Phone Number:  Orean Giarratano [phone]    Current Level of Care: Hospital Recommended Level of Care: [place] Prior Approval Number:    Date Approved/Denied:   PASRR Number:   [id number]    Discharge Plan: SNF    Current Diagnoses: Patient Active Problem List   Diagnosis Date Noted  . CKD (chronic kidney disease), stage IV ([place]) 07/17/2017  . Right leg swelling 07/16/2017  . Leukocytosis 07/16/2017  . Bradycardia 07/16/2017  . Physical deconditioning 07/16/2017  . Right knee pain 07/16/2017  . Anxiety 07/16/2017  . COPD (chronic obstructive pulmonary disease) ([place])   . Hypertension   . Alzheimer's dementia   . Weakness     Orientation RESPIRATION BLADDER Height & Weight     Self  O2(2L nasal canula) Incontinent, External catheter Weight: 112 lb 10.5 oz (51.1 kg) Height:     BEHAVIORAL SYMPTOMS/MOOD NEUROLOGICAL BOWEL NUTRITION STATUS      Continent Diet(see discharge summary)  AMBULATORY STATUS COMMUNICATION OF NEEDS Skin   Total Care(uses wheelchair was previously independent for transfers) Verbally Other (Comment)(skin tears on perineum and labia)                       Personal Care Assistance Level of Assistance  Bathing, Feeding, Dressing Bathing Assistance: Maximum assistance Feeding assistance: Limited assistance Dressing Assistance: Maximum assistance     Functional Limitations Info  Sight, Hearing, Speech Sight Info: Adequate Hearing Info: Adequate(wears glasses) Speech Info: Adequate     SPECIAL CARE FACTORS FREQUENCY  OT (By licensed OT), PT (By licensed PT)     PT Frequency: 5x week OT Frequency: 5x week            Contractures Contractures Info: Not present    Additional Factors Info  Code Status, Allergies, Psychotropic Code Status Info: DNR Allergies Info: No Known Allergies Psychotropic Info: donepezil (ARICEPT) tablet 10 mg at bedtime         Current Medications (07/18/2017):  This is the current hospital active medication list Current Facility-Administered Medications  Medication Dose Route Frequency Provider Last Rate Last Dose  . 0.9 %  sodium chloride infusion   Intravenous Continuous Rama, Venetia Maxon, MD 75 mL/hr at 07/18/17 0813    . acetaminophen (TYLENOL) tablet 650 mg  650 mg Oral Q6H PRN Rama, Venetia Maxon, MD       Or  . acetaminophen (TYLENOL) suppository 650 mg  650 mg Rectal Q6H PRN Rama, Christina P, MD      . albuterol (PROVENTIL) (2.5 MG/3ML) 0.083% nebulizer solution 2.5 mg  2.5 mg Nebulization Q4H PRN Rama, Venetia Maxon, MD      . calcium-vitamin D (OSCAL WITH D) 500-200 MG-UNIT per tablet 1 tablet  1 tablet Oral BID WC Rama, Venetia Maxon, MD   1 tablet at 07/18/17 0926  . dextromethorphan-guaiFENesin (MUCINEX DM) 30-600 MG per 12 hr tablet 1 tablet  1 tablet Oral BID PRN Rama, Venetia Maxon, MD      . donepezil (ARICEPT) tablet 10 mg  10 mg Oral QHS  Rama, Venetia Maxon, MD   10 mg at 07/17/17 2148  . feeding supplement (ENSURE ENLIVE) (ENSURE ENLIVE) liquid 237 mL  237 mL Oral BID BM Rama, Venetia Maxon, MD   237 mL at 07/18/17 0928  . hydrALAZINE (APRESOLINE) injection 5 mg  5 mg Intravenous Q2H PRN Rama, Venetia Maxon, MD      . hydrALAZINE (APRESOLINE) tablet 25 mg  25 mg Oral BID Rama, Venetia Maxon, MD   25 mg at 07/18/17 2883  . hydrOXYzine (ATARAX/VISTARIL) tablet 10 mg  10 mg Oral TID PRN Rama, Venetia Maxon, MD      . ipratropium-albuterol (DUONEB) 0.5-2.5 (3) MG/3ML nebulizer solution 3 mL  3 mL Nebulization BID Rama, Venetia Maxon, MD    3 mL at 07/18/17 0935  . loratadine (CLARITIN) tablet 10 mg  10 mg Oral Daily Rama, Venetia Maxon, MD   10 mg at 07/18/17 0932  . LORazepam (ATIVAN) tablet 0.5 mg  0.5 mg Oral BID Rama, Venetia Maxon, MD   0.5 mg at 07/18/17 0926  . oxyCODONE-acetaminophen (PERCOCET/ROXICET) 5-325 MG per tablet 1 tablet  1 tablet Oral Q4H PRN Rama, Venetia Maxon, MD      . predniSONE (DELTASONE) tablet 10 mg  10 mg Oral Q breakfast Rama, Venetia Maxon, MD   10 mg at 07/18/17 0926  . senna-docusate (Senokot-S) tablet 1 tablet  1 tablet Oral QHS PRN Rama, Venetia Maxon, MD      . traZODone (DESYREL) tablet 50 mg  50 mg Oral QHS Rama, Venetia Maxon, MD   50 mg at 07/17/17 2148     Discharge Medications: Please see discharge summary for a list of discharge medications.  Relevant Imaging Results:  Relevant Lab Results:   Additional Information SS# [place], [place]

## 2017-07-18 NOTE — Social Work (Signed)
Pt asleep, did not wake upon CSW arrival. CSW completed assessment with son Jeneen Rinks. CSW left current offers at bedside, made RN aware. Pt son will look, CSW will update with any further offers tomorrow morning.   CSW continuing to follow.   [place], [place] Work [phone]

## 2017-07-18 NOTE — Consult Note (Addendum)
Reason for Consult:Knee pain Referring Physician: S Rizwan  Priscilla Ramirez is an 84 y.o. female.  HPI: Priscilla Ramirez was brought to the hospital with 2 weeks of generalized weakness. This was accompanied by a respiratory illness. At some point she also began c/o right knee pain and swelling. It's unclear when exactly this started; she is not the best historian. She denies any locking or any falls or trauma however. A MRI was obtained that showed a mod joint effusion, meniscal tear, and soft tissue swelling on the lateral aspect and orthopedic surgery was consulted.  Past Medical History:  Diagnosis Date  . Alzheimer's dementia   . COPD (chronic obstructive pulmonary disease) ([place])   . Hypertension     History reviewed. No pertinent surgical history.  Family History  Problem Relation Age of Onset  . Heart attack Mother   . Cancer Father     Social History:  reports that she has quit smoking. Her smoking use included cigarettes. She has never used smokeless tobacco. She reports that she has current or past drug history. She reports that she does not drink alcohol.  Allergies: No Known Allergies  Medications: I have reviewed the patient's current medications.  Results for orders placed or performed during the hospital encounter of 07/16/17 (from the past 48 hour(s))  Comprehensive metabolic panel     Status: Abnormal   Collection Time: 07/16/17  5:50 PM  Result Value Ref Range   Sodium 142 135 - 145 mmol/L   Potassium 4.4 3.5 - 5.1 mmol/L   Chloride 105 101 - 111 mmol/L   CO2 26 22 - 32 mmol/L   Glucose, Bld 106 (H) 65 - 99 mg/dL   BUN 40 (H) 6 - 20 mg/dL   Creatinine, Ser 1.61 (H) 0.44 - 1.00 mg/dL   Calcium 9.4 8.9 - 10.3 mg/dL   Total Protein 6.9 6.5 - 8.1 g/dL   Albumin 3.1 (L) 3.5 - 5.0 g/dL   AST 49 (H) 15 - 41 U/L   ALT 18 14 - 54 U/L   Alkaline Phosphatase 57 38 - 126 U/L   Total Bilirubin 1.1 0.3 - 1.2 mg/dL   GFR calc non Af Amer 26 (L) >60 mL/min   GFR calc Af Amer 30  (L) >60 mL/min    Comment: (NOTE) The eGFR has been calculated using the CKD EPI equation. This calculation has not been validated in all clinical situations. eGFR's persistently <60 mL/min signify possible Chronic Kidney Disease.    Anion gap 11 5 - 15    Comment: Performed at [facility] [address], [place], [place] [address]  CBC with Differential     Status: Abnormal   Collection Time: 07/16/17  5:50 PM  Result Value Ref Range   WBC 16.1 (H) 4.0 - 10.5 K/uL   RBC 4.51 3.87 - 5.11 MIL/uL   Hemoglobin 13.3 12.0 - 15.0 g/dL   HCT 41.4 36.0 - 46.0 %   MCV 91.8 78.0 - 100.0 fL   MCH 29.5 26.0 - 34.0 pg   MCHC 32.1 30.0 - 36.0 g/dL   RDW 14.3 11.5 - 15.5 %   Platelets 269 150 - 400 K/uL   Neutrophils Relative % 82 %   Neutro Abs 13.2 1.7 - 7.7 K/uL   Lymphocytes Relative 10 %   Lymphs Abs 1.6 0.7 - 4.0 K/uL   Monocytes Relative 8 %   Monocytes Absolute 1.2 0.1 - 1.0 K/uL   Eosinophils Relative 0 %  Eosinophils Absolute 0.1 0.0 - 0.7 K/uL   Basophils Relative 0 %   Basophils Absolute 0.0 0.0 - 0.1 K/uL    Comment: Performed at [facility], [place] [address] [place], [place] [address]  I-stat troponin, ED     Status: None   Collection Time: 07/16/17  6:05 PM  Result Value Ref Range   Troponin i, poc 0.00 0.00 - 0.08 ng/mL   Comment 3            Comment: Due to the release kinetics of cTnI, a negative result within the first hours of the onset of symptoms does not rule out myocardial infarction with certainty. If myocardial infarction is still suspected, repeat the test at appropriate intervals.   Urinalysis, Routine w reflex microscopic     Status: Abnormal   Collection Time: 07/16/17  9:05 PM  Result Value Ref Range   Color, Urine YELLOW YELLOW   APPearance CLEAR CLEAR   Specific Gravity, Urine 1.019 1.005 - 1.030   pH 5.0 5.0 - 8.0   Glucose, UA NEGATIVE NEGATIVE mg/dL   Hgb urine dipstick NEGATIVE NEGATIVE   Bilirubin Urine NEGATIVE NEGATIVE    Ketones, ur NEGATIVE NEGATIVE mg/dL   Protein, ur 100 (A) NEGATIVE mg/dL   Nitrite NEGATIVE NEGATIVE   Leukocytes, UA NEGATIVE NEGATIVE   RBC / HPF 0-5 0 - 5 RBC/hpf   WBC, UA 0-5 0 - 5 WBC/hpf   Bacteria, UA NONE SEEN NONE SEEN   Squamous Epithelial / LPF 0-5 (A) NONE SEEN   Mucus PRESENT    Hyaline Casts, UA PRESENT     Comment: Performed at [facility], [address] [place], [place] [address]  Lactic acid, plasma     Status: None   Collection Time: 07/16/17 10:49 PM  Result Value Ref Range   Lactic Acid, Venous 1.3 0.5 - 1.9 mmol/L    Comment: Performed at [facility] [address] [place], [place] [address]  Uric acid     Status: Abnormal   Collection Time: 07/16/17 11:59 PM  Result Value Ref Range   Uric Acid, Serum 7.6 (H) 2.3 - 6.6 mg/dL    Comment: Performed at [facility] [address], [place], [place] [address]  Brain natriuretic peptide     Status: Abnormal   Collection Time: 07/16/17 11:59 PM  Result Value Ref Range   B Natriuretic Peptide 141.7 (H) 0.0 - 100.0 pg/mL    Comment: Performed at [facility] [address], [place], [place] [address]  Troponin I (q 6hr x 3)     Status: Abnormal   Collection Time: 07/16/17 11:59 PM  Result Value Ref Range   Troponin I 0.03 (HH) <0.03 ng/mL    Comment: CRITICAL RESULT CALLED TO, READ BACK BY AND VERIFIED WITH: T.JORDAN,RN 0106 07/17/17 M.CAMPBELL Performed at [facility], [place] [address] [place], [place] [address]   Culture, blood (Routine X 2) w Reflex to ID Panel     Status: None (Preliminary result)   Collection Time: 07/17/17 12:00 AM  Result Value Ref Range   Specimen Description BLOOD BLOOD RIGHT FOREARM    Special Requests      BOTTLES DRAWN AEROBIC AND ANAEROBIC Blood Culture adequate volume   Culture      NO GROWTH 1 DAY Performed at [facility], [place] [address] [place], [place] [address]    Report Status PENDING   Culture, blood (Routine  X 2) w Reflex to  ID Panel     Status: None (Preliminary result)   Collection Time: 07/17/17 12:00 AM  Result Value Ref Range   Specimen Description BLOOD BLOOD LEFT FOREARM    Special Requests      BOTTLES DRAWN AEROBIC AND ANAEROBIC Blood Culture adequate volume   Culture      NO GROWTH 1 DAY Performed at [facility], [place] [address], [place], [place] [address]    Report Status PENDING   Troponin I (q 6hr x 3)     Status: Abnormal   Collection Time: 07/17/17  5:40 AM  Result Value Ref Range   Troponin I 0.03 (HH) <0.03 ng/mL    Comment: CRITICAL VALUE NOTED.  VALUE IS CONSISTENT WITH PREVIOUSLY REPORTED AND CALLED VALUE. Performed at [facility], [place] [address], [place], [place] [address]   Protime-INR     Status: None   Collection Time: 07/17/17  5:40 AM  Result Value Ref Range   Prothrombin Time 14.7 11.4 - 15.2 seconds   INR 1.15     Comment: Performed at [facility], [place] [address] [place], [place] [address]  Sedimentation rate     Status: Abnormal   Collection Time: 07/17/17  5:40 AM  Result Value Ref Range   Sed Rate 27 (H) 0 - 22 mm/hr    Comment: Performed at [facility] [address] [place], [place] [address]  Basic metabolic panel     Status: Abnormal   Collection Time: 07/18/17  5:20 AM  Result Value Ref Range   Sodium 141 135 - 145 mmol/L   Potassium 4.0 3.5 - 5.1 mmol/L   Chloride 112 (H) 101 - 111 mmol/L   CO2 21 (L) 22 - 32 mmol/L   Glucose, Bld 142 (H) 65 - 99 mg/dL   BUN 43 (H) 6 - 20 mg/dL   Creatinine, Ser 1.27 (H) 0.44 - 1.00 mg/dL   Calcium 7.7 (L) 8.9 - 10.3 mg/dL   GFR calc non Af Amer 35 (L) >60 mL/min   GFR calc Af Amer 41 (L) >60 mL/min    Comment: (NOTE) The eGFR has been calculated using the CKD EPI equation. This calculation has not been validated in all clinical situations. eGFR's persistently <60 mL/min signify possible Chronic Kidney Disease.    Anion gap 8 5 - 15    Comment: Performed at [facility]  [address] [place], [place] [address]    Dg Pelvis 1-2 Views  Result Date: 07/16/2017 CLINICAL DATA:  Fall EXAM: PELVIS - 1-2 VIEW COMPARISON:  None. FINDINGS: Scoliosis and degenerative changes of the lower lumbar spine. Mild SI joint degenerative changes. Pubic symphysis and rami are intact. Both femoral heads project in joint. No definite acute displaced fracture is seen. There are vascular calcifications. IMPRESSION: Slightly limited evaluation of femoral necks due to superimposition of the trochanters. No definite acute osseous abnormality is seen Electronically Signed   By: Donavan Foil M.D.   On: 07/16/2017 21:36   Dg Tibia/fibula Right  Result Date: 07/16/2017 CLINICAL DATA:  Fall EXAM: RIGHT TIBIA AND FIBULA - 2 VIEW COMPARISON:  None. FINDINGS: No fracture or malalignment is seen. Vascular calcifications. Small plantar calcaneal spur. Chondrocalcinosis at the knee. IMPRESSION: Soft tissue swelling. No acute osseous abnormality. Chondrocalcinosis at the knee. Electronically Signed   By: Donavan Foil M.D.   On: 07/16/2017 21:37   Ct Head Wo Contrast  Result  Date: 07/16/2017 CLINICAL DATA:  Altered mental status. EXAM: CT HEAD WITHOUT CONTRAST TECHNIQUE: Contiguous axial images were obtained from the base of the skull through the vertex without intravenous contrast. COMPARISON:  None. FINDINGS: Brain: Enlargement of the lateral and third ventricles out of proportion to the degree of sulcal enlargement. Crowding of the gyri at the vertex. Prominent periventricular and subcortical white matter hypodensities are nonspecific. No evidence of acute infarction, hemorrhage, extra-axial collection or mass lesion/mass effect. Vascular: Calcified atherosclerosis at the skullbase. No hyperdense vessel. Skull: Negative for fracture or focal lesion. Sinuses/Orbits: No acute finding. Other: None. IMPRESSION: 1. Enlargement of the lateral and third ventricles out of proportion to the degree of sulcal enlargement/cortical  atrophy, suspicious for normal pressure hydrocephalus. Electronically Signed   By: Titus Dubin M.D.   On: 07/16/2017 18:12   Mr Knee Right Wo Contrast  Result Date: 07/17/2017 CLINICAL DATA:  Right knee pain and swelling. EXAM: MRI OF THE RIGHT KNEE WITHOUT CONTRAST TECHNIQUE: Multiplanar, multisequence MR imaging of the knee was performed. No intravenous contrast was administered. COMPARISON:  Radiographs 07/16/2017 FINDINGS: Examination is quite limited as the patient could not hold still. MENISCI Medial meniscus: There is a flap type inferior articular surface tear involving the posterior horn mid body junction region with a small flipped meniscal fragment in the medial gutter. Lateral meniscus:  Degenerative changes without obvious tear. LIGAMENTS Cruciates:  Intact Collaterals:  Intact CARTILAGE Patellofemoral:  Moderate degenerative chondrosis. Medial:  Moderate degenerative chondrosis. Lateral:  Moderate degenerative chondrosis. Joint:  Small to moderate joint effusion. Popliteal Fossa:  Moderate-sized Baker's cyst which may be leaking. Extensor Mechanism: The patella retinacular structures are intact and the quadriceps and patellar tendons are grossly intact. Bones:  No bone contusion, marrow edema or osteochondral lesion. Other: Subcutaneous soft tissue swelling/edema/fluid mainly around the lateral aspect of the knee and lower thigh of uncertain significance or etiology. IMPRESSION: 1. Limited examination. 2. Medial meniscus tear. 3. Intact ligamentous structures and no acute bony findings. 4. Small to moderate joint effusion and moderate-sized Baker's cyst. No MR findings suspicious for septic arthritis or osteomyelitis. 5. Mild tricompartmental degenerative changes. Electronically Signed   By: Marijo Sanes M.D.   On: 07/17/2017 12:24   Dg Chest Port 1 View  Result Date: 07/16/2017 CLINICAL DATA:  Increasing weakness EXAM: PORTABLE CHEST 1 VIEW COMPARISON:  07/12/2017 FINDINGS: Cardiac shadow  is stable. Aortic calcifications are again seen. The lungs are well aerated bilaterally without focal infiltrate or sizable effusion. No acute bony abnormality is noted. IMPRESSION: No active disease. Electronically Signed   By: Inez Catalina M.D.   On: 07/16/2017 19:07    Review of Systems  Constitutional: Negative for weight loss.  HENT: Negative for ear discharge, ear pain, hearing loss and tinnitus.   Eyes: Negative for blurred vision, double vision, photophobia and pain.  Respiratory: Negative for cough, sputum production and shortness of breath.   Cardiovascular: Negative for chest pain.  Gastrointestinal: Negative for abdominal pain, nausea and vomiting.  Genitourinary: Negative for dysuria, flank pain, frequency and urgency.  Musculoskeletal: Positive for joint pain. Negative for back pain (Right knee), falls, myalgias and neck pain.  Neurological: Positive for weakness. Negative for dizziness, tingling, sensory change, focal weakness, loss of consciousness and headaches.  Endo/Heme/Allergies: Does not bruise/bleed easily.  Psychiatric/Behavioral: Negative for depression, memory loss and substance abuse. The patient is not nervous/anxious.    Blood pressure (!) 144/46, pulse (!) 57, temperature 98.3 F (36.8 C), temperature source Oral, resp. rate Marland Kitchen)  23, weight 51.1 kg (112 lb 10.5 oz), SpO2 97 %. Physical Exam  Constitutional: She appears well-developed and well-nourished. No distress.  HENT:  Head: Normocephalic and atraumatic.  Eyes: Conjunctivae are normal. Right eye exhibits no discharge. Left eye exhibits no discharge. No scleral icterus.  Neck: Normal range of motion.  Cardiovascular: Normal rate and regular rhythm.  Respiratory: Effort normal. No respiratory distress.  Musculoskeletal:  RLE No traumatic wounds, ecchymosis, or rash  Knee TTP medial aspect, mild effusion, negative McMurrays, Lachmann. PROM 180-75 minimally painful  No ankle effusion  Knee stable to varus/  valgus and anterior/posterior stress but painful  Sens DPN, SPN, TN intact  Motor EHL, ext, flex, evers 5/5  DP 2+, PT 1+, No significant edema  Neurological: She is alert.  Skin: Skin is warm and dry. She is not diaphoretic.  Psychiatric: She has a normal mood and affect. Her behavior is normal.    Assessment/Plan: Right knee pain -- Given the history I suspect this is a reactive arthritis. Will treat conservatively with NSAID's (watch renal fxn), compression, and ice. Should she worsen or fail to improve could consider joint aspiration and injection, would likely accomplish that as OP. Ok to [employer]. Suspect meniscal tear is old and incidental, no treatment necessary.    Lisette Abu, PA-C Orthopedic Surgery [phone] 07/18/2017, 2:59 PM

## 2017-07-18 NOTE — Progress Notes (Signed)
PROGRESS NOTE    Priscilla Ramirez   JQB:[id number]  DOB: 12-26-1920  DOA: 07/16/2017 PCP: Merlene Laughter, MD   Brief Narrative:  Priscilla Ramirez is an 84 y.o. female from ALF with a PMH of hypertension, COPD, Alzheimer's disease, anxiety, and stage IIIc KD who was admitted 07/16/17 for evaluation of right knee pain and right leg swelling associated with generalized weakness and bradycardia. She has been weak 2 weeks and has not been eating well. Per EMS report, heart rate noted to abruptly decreased to 32 requiring 0.5 mg of atropine and upon arrival to the ED, her heart rate was in the 40s. WBC was 16.1 , O2 saturation of 86% on room air which improved to 100% on oxygen via nasal cannula.     Subjective: Not sure if she is having pain in her right knee.  ROS: no complaints of nausea, vomiting, constipation diarrhea, cough, dyspnea or dysuria. No other complaints.   Assessment & Plan:   Principal Problem:   Right knee pain- per son, the entire right leg was swollen and is getting better - reason for admission- MRI> Medial meniscus tear- Small to moderate joint effusion and moderate-sized Baker's cyst. Mild tricompartmental degenerative changes. - ortho eval done today- suspecting reactive arthritis - started Naproxen and ACE wrap - Uric acid 7.6 - can d/c antibiotics started for septic arthritis - can continue steroids (started on 4/3) for gout and arthritis - Pt and OT recommend SNF vs 24 hr assistance- wheels herself around in a wheelchair at baseline at ALF  Active Problems: Bradycardia/   Hypertension prolonged QT (> 550 on EKG) - Metoprolol held on admission- HR in 50-60 and dipping down to 40s at times - cont Hydralazine at home dose- follow BP and HR    COPD (chronic obstructive pulmonary disease)  - stable-CXR clear- no dyspnea- wean to room air if possible    Alzheimer's dementia - Aricept    Anxiety - PRN Ativan BID    AKI  - dehydration - Cr 1.7 on admission-  improving on IVF- may have have reached baseline yet- cont slow IVF  Mildly elevated troponin - no chest pain noted- flat trend    DVT prophylaxis: SCDs Code Status: DNR Family Communication: son Clair Gulling at bedside Disposition Plan: possibly SNF Consultants:   ortho Procedures:   none Antimicrobials:  Anti-infectives (From admission, onward)   Start     Dose/Rate Route Frequency Ordered Stop   07/18/17 2200  vancomycin (VANCOCIN) IVPB 750 mg/150 ml premix  Status:  Discontinued     750 mg 150 mL/hr over 60 Minutes Intravenous Every 48 hours 07/17/17 0243 07/18/17 1340   07/16/17 2300  vancomycin (VANCOCIN) IVPB 750 mg/150 ml premix     750 mg 150 mL/hr over 60 Minutes Intravenous  Once 07/16/17 2255 07/17/17 0104       Objective: Vitals:   07/18/17 0444 07/18/17 0800 07/18/17 0936 07/18/17 1202  BP: (!) 167/67 (!) 150/62  (!) 144/46  Pulse: (!) 55 (!) 51  (!) 57  Resp:      Temp: 98.8 F (37.1 C) (!) 97.4 F (36.3 C)  98.3 F (36.8 C)  TempSrc: Oral Axillary  Oral  SpO2: 99% 100% 100% 97%  Weight:  51.1 kg (112 lb 10.5 oz)      Intake/Output Summary (Last 24 hours) at 07/18/2017 1620 Last data filed at 07/18/2017 1345 Gross per 24 hour  Intake 2941.25 ml  Output 300 ml  Net 2641.25 ml  Filed Weights   07/18/17 0800  Weight: 51.1 kg (112 lb 10.5 oz)    Examination: General exam: Appears comfortable  HEENT: PERRLA, oral mucosa moist, no sclera icterus or thrush Respiratory system: Clear to auscultation. Respiratory effort normal. Cardiovascular system: S1 & S2 heard, RRR.  No murmurs  Gastrointestinal system: Abdomen soft, non-tender, nondistended. Normal bowel sound. No organomegaly Extremities: No cyanosis, clubbing - left knee quite swollen and mildly tender- no erythema or increased warmth Skin: No rashes or ulcers Psychiatry:  Mood & affect appropriate.     Data Reviewed: I have personally reviewed following labs and imaging studies  CBC: Recent Labs   Lab 07/12/17 1139 07/16/17 1750  WBC 13.1* 16.1*  NEUTROABS 9.2* 13.2  HGB 13.2 13.3  HCT 40.6 41.4  MCV 92.1 91.8  PLT 265 662   Basic Metabolic Panel: Recent Labs  Lab 07/12/17 1139 07/16/17 1750 07/18/17 0520  NA 143 142 141  K 4.0 4.4 4.0  CL 102 105 112*  CO2 31 26 21*  GLUCOSE 124* 106* 142*  BUN 46* 40* 43*  CREATININE 1.70* 1.61* 1.27*  CALCIUM 9.7 9.4 7.7*   GFR: Estimated Creatinine Clearance: 19.5 mL/min (A) (by C-G formula based on SCr of 1.27 mg/dL (H)). Liver Function Tests: Recent Labs  Lab 07/12/17 1139 07/16/17 1750  AST 26 49*  ALT 9* 18  ALKPHOS 48 57  BILITOT 0.6 1.1  PROT 6.4* 6.9  ALBUMIN 3.3* 3.1*   No results for input(s): LIPASE, AMYLASE in the last 168 hours. No results for input(s): AMMONIA in the last 168 hours. Coagulation Profile: Recent Labs  Lab 07/17/17 0540  INR 1.15   Cardiac Enzymes: Recent Labs  Lab 07/16/17 2359 07/17/17 0540  TROPONINI 0.03* 0.03*   BNP (last 3 results) No results for input(s): PROBNP in the last 8760 hours. HbA1C: No results for input(s): HGBA1C in the last 72 hours. CBG: No results for input(s): GLUCAP in the last 168 hours. Lipid Profile: No results for input(s): CHOL, HDL, LDLCALC, TRIG, CHOLHDL, LDLDIRECT in the last 72 hours. Thyroid Function Tests: No results for input(s): TSH, T4TOTAL, FREET4, T3FREE, THYROIDAB in the last 72 hours. Anemia Panel: No results for input(s): VITAMINB12, FOLATE, FERRITIN, TIBC, IRON, RETICCTPCT in the last 72 hours. Urine analysis:    Component Value Date/Time   COLORURINE YELLOW 07/16/2017 2105   APPEARANCEUR CLEAR 07/16/2017 2105   LABSPEC 1.019 07/16/2017 2105   PHURINE 5.0 07/16/2017 2105   GLUCOSEU NEGATIVE 07/16/2017 2105   HGBUR NEGATIVE 07/16/2017 2105   BILIRUBINUR NEGATIVE 07/16/2017 2105   [place] NEGATIVE 07/16/2017 2105   PROTEINUR 100 (A) 07/16/2017 2105   NITRITE NEGATIVE 07/16/2017 2105   LEUKOCYTESUR NEGATIVE 07/16/2017 2105     Sepsis Labs: @LABRCNTIP (procalcitonin:4,lacticidven:4) ) Recent Results (from the past 240 hour(s))  Culture, blood (Routine X 2) w Reflex to ID Panel     Status: None (Preliminary result)   Collection Time: 07/17/17 12:00 AM  Result Value Ref Range Status   Specimen Description BLOOD BLOOD RIGHT FOREARM  Final   Special Requests   Final    BOTTLES DRAWN AEROBIC AND ANAEROBIC Blood Culture adequate volume   Culture   Final    NO GROWTH 1 DAY Performed at [facility], [place] [address], [place], [place] [address]    Report Status PENDING  Incomplete  Culture, blood (Routine X 2) w Reflex to ID Panel     Status: None (Preliminary result)   Collection Time: 07/17/17 12:00 AM  Result Value Ref Range Status   Specimen Description BLOOD BLOOD LEFT FOREARM  Final   Special Requests   Final    BOTTLES DRAWN AEROBIC AND ANAEROBIC Blood Culture adequate volume   Culture   Final    NO GROWTH 1 DAY Performed at [facility], [place] [address] [place], [place] [address]    Report Status PENDING  Incomplete         Radiology Studies: Dg Pelvis 1-2 Views  Result Date: 07/16/2017 CLINICAL DATA:  Fall EXAM: PELVIS - 1-2 VIEW COMPARISON:  None. FINDINGS: Scoliosis and degenerative changes of the lower lumbar spine. Mild SI joint degenerative changes. Pubic symphysis and rami are intact. Both femoral heads project in joint. No definite acute displaced fracture is seen. There are vascular calcifications. IMPRESSION: Slightly limited evaluation of femoral necks due to superimposition of the trochanters. No definite acute osseous abnormality is seen Electronically Signed   By: Donavan Foil M.D.   On: 07/16/2017 21:36   Dg Tibia/fibula Right  Result Date: 07/16/2017 CLINICAL DATA:  Fall EXAM: RIGHT TIBIA AND FIBULA - 2 VIEW COMPARISON:  None. FINDINGS: No fracture or malalignment is seen. Vascular calcifications. Small plantar calcaneal spur. Chondrocalcinosis at the knee. IMPRESSION: Soft  tissue swelling. No acute osseous abnormality. Chondrocalcinosis at the knee. Electronically Signed   By: Donavan Foil M.D.   On: 07/16/2017 21:37   Ct Head Wo Contrast  Result Date: 07/16/2017 CLINICAL DATA:  Altered mental status. EXAM: CT HEAD WITHOUT CONTRAST TECHNIQUE: Contiguous axial images were obtained from the base of the skull through the vertex without intravenous contrast. COMPARISON:  None. FINDINGS: Brain: Enlargement of the lateral and third ventricles out of proportion to the degree of sulcal enlargement. Crowding of the gyri at the vertex. Prominent periventricular and subcortical white matter hypodensities are nonspecific. No evidence of acute infarction, hemorrhage, extra-axial collection or mass lesion/mass effect. Vascular: Calcified atherosclerosis at the skullbase. No hyperdense vessel. Skull: Negative for fracture or focal lesion. Sinuses/Orbits: No acute finding. Other: None. IMPRESSION: 1. Enlargement of the lateral and third ventricles out of proportion to the degree of sulcal enlargement/cortical atrophy, suspicious for normal pressure hydrocephalus. Electronically Signed   By: Titus Dubin M.D.   On: 07/16/2017 18:12   Mr Knee Right Wo Contrast  Result Date: 07/17/2017 CLINICAL DATA:  Right knee pain and swelling. EXAM: MRI OF THE RIGHT KNEE WITHOUT CONTRAST TECHNIQUE: Multiplanar, multisequence MR imaging of the knee was performed. No intravenous contrast was administered. COMPARISON:  Radiographs 07/16/2017 FINDINGS: Examination is quite limited as the patient could not hold still. MENISCI Medial meniscus: There is a flap type inferior articular surface tear involving the posterior horn mid body junction region with a small flipped meniscal fragment in the medial gutter. Lateral meniscus:  Degenerative changes without obvious tear. LIGAMENTS Cruciates:  Intact Collaterals:  Intact CARTILAGE Patellofemoral:  Moderate degenerative chondrosis. Medial:  Moderate degenerative  chondrosis. Lateral:  Moderate degenerative chondrosis. Joint:  Small to moderate joint effusion. Popliteal Fossa:  Moderate-sized Baker's cyst which may be leaking. Extensor Mechanism: The patella retinacular structures are intact and the quadriceps and patellar tendons are grossly intact. Bones:  No bone contusion, marrow edema or osteochondral lesion. Other: Subcutaneous soft tissue swelling/edema/fluid mainly around the lateral aspect of the knee and lower thigh of uncertain significance or etiology. IMPRESSION: 1. Limited examination. 2. Medial meniscus tear. 3. Intact ligamentous structures and no acute bony findings. 4. Small to moderate joint effusion and moderate-sized Baker's cyst. No MR  findings suspicious for septic arthritis or osteomyelitis. 5. Mild tricompartmental degenerative changes. Electronically Signed   By: Marijo Sanes M.D.   On: 07/17/2017 12:24   Dg Chest Port 1 View  Result Date: 07/16/2017 CLINICAL DATA:  Increasing weakness EXAM: PORTABLE CHEST 1 VIEW COMPARISON:  07/12/2017 FINDINGS: Cardiac shadow is stable. Aortic calcifications are again seen. The lungs are well aerated bilaterally without focal infiltrate or sizable effusion. No acute bony abnormality is noted. IMPRESSION: No active disease. Electronically Signed   By: Inez Catalina M.D.   On: 07/16/2017 19:07      Scheduled Meds: . calcium-vitamin D  1 tablet Oral BID WC  . donepezil  10 mg Oral QHS  . feeding supplement (ENSURE ENLIVE)  237 mL Oral BID BM  . hydrALAZINE  25 mg Oral BID  . ipratropium-albuterol  3 mL Nebulization BID  . loratadine  10 mg Oral Daily  . LORazepam  0.5 mg Oral BID  . naproxen  500 mg Oral BID WC  . predniSONE  10 mg Oral Q breakfast  . traZODone  50 mg Oral QHS   Continuous Infusions: . sodium chloride 75 mL/hr at 07/18/17 0813     LOS: 2 days    Time spent in minutes: 35    Debbe Odea, MD [facility] Pager: [email or web address] Password [email or web address] 07/18/2017, 4:20 PM

## 2017-07-18 NOTE — Care Management Note (Signed)
Case Management Note  Patient Details  Name: Deb Loudin MRN: [id number] Date of Birth: 1921-03-19  Subjective/Objective:   84 y.o. female with medical history significant of hypertension, COPD, Alzheimer's disease, anxiety, CKD-3, who presents with right knee pain and swelling, and right leg swelling, generalized weakness, bradycardia.  MRI shows medial meniscus tear of RLE.  PTA, pt from [facility].   Action/Plan: PT/OT recommending SNF for short term rehab.  CSW following to facilitate dc to SNF upon medical stability.    Expected Discharge Date:     07/19/17             Expected Discharge Plan:  [place]  In-House Referral:  Clinical Social Work  Discharge planning Services  CM Consult  Post Acute Care Choice:    Choice offered to:     DME Arranged:    DME Agency:     HH Arranged:    [place] Agency:     Status of Service:  In process, will continue to follow  If discussed at Long Length of Stay Meetings, dates discussed:    Additional Comments:  07/19/17 J. Ryiah Bellissimo, RN, BSN Pt medically stable for dc today.  Plan dc to [facility] today, per CSW arrangements.    Reinaldo Raddle, RN, BSN  Trauma/Neuro ICU Case Manager [phone]

## 2017-07-19 ENCOUNTER — Other Ambulatory Visit: Payer: Self-pay

## 2017-07-19 DIAGNOSIS — N184 Chronic kidney disease, stage 4 (severe): Secondary | ICD-10-CM

## 2017-07-19 LAB — CBC
HCT: 36.5 % (ref 36.0–46.0)
Hemoglobin: 11.4 g/dL — ABNORMAL LOW (ref 12.0–15.0)
MCH: 28.6 pg (ref 26.0–34.0)
MCHC: 31.2 g/dL (ref 30.0–36.0)
MCV: 91.7 fL (ref 78.0–100.0)
Platelets: 248 10*3/uL (ref 150–400)
RBC: 3.98 MIL/uL (ref 3.87–5.11)
RDW: 14.4 % (ref 11.5–15.5)
WBC: 12.4 10*3/uL — ABNORMAL HIGH (ref 4.0–10.5)

## 2017-07-19 LAB — BASIC METABOLIC PANEL
Anion gap: 7 (ref 5–15)
BUN: 42 mg/dL — AB (ref 6–20)
CHLORIDE: 110 mmol/L (ref 101–111)
CO2: 23 mmol/L (ref 22–32)
Calcium: 8 mg/dL — ABNORMAL LOW (ref 8.9–10.3)
Creatinine, Ser: 1.18 mg/dL — ABNORMAL HIGH (ref 0.44–1.00)
GFR calc non Af Amer: 38 mL/min — ABNORMAL LOW (ref >60)
GFR, EST AFRICAN AMERICAN: 44 mL/min — AB (ref >60)
Glucose, Bld: 106 mg/dL — ABNORMAL HIGH (ref 65–99)
Potassium: 3.8 mmol/L (ref 3.5–5.1)
Sodium: 140 mmol/L (ref 135–145)

## 2017-07-19 LAB — MRSA PCR SCREENING: MRSA by PCR: POSITIVE — AB

## 2017-07-19 MED ORDER — NAPROXEN 500 MG PO TABS: 500.0000 mg | ORAL_TABLET | Freq: Two times a day (BID) | ORAL | Status: DC

## 2017-07-19 MED ORDER — ENSURE ENLIVE PO LIQD: 237.0000 mL | Freq: Two times a day (BID) | ORAL | 12 refills | Status: AC

## 2017-07-19 MED ORDER — PREDNISONE 10 MG PO TABS: 10.0000 mg | ORAL_TABLET | Freq: Every day | ORAL | Status: AC

## 2017-07-19 MED ORDER — CHLORHEXIDINE GLUCONATE CLOTH 2 % EX PADS
6.0000 | MEDICATED_PAD | Freq: Every day | CUTANEOUS | Status: DC
  Administered 2017-07-19: 6 via TOPICAL

## 2017-07-19 MED ORDER — MUPIROCIN 2 % EX OINT
1.0000 "application " | TOPICAL_OINTMENT | Freq: Two times a day (BID) | CUTANEOUS | Status: DC
  Administered 2017-07-19: 1 via NASAL
  Filled 2017-07-19: qty 22

## 2017-07-19 MED ORDER — DM-GUAIFENESIN ER 30-600 MG PO TB12: 1.0000 | ORAL_TABLET | Freq: Two times a day (BID) | ORAL | Status: AC | PRN

## 2017-07-19 MED ORDER — ALBUTEROL SULFATE (2.5 MG/3ML) 0.083% IN NEBU: 2.5000 mg | INHALATION_SOLUTION | RESPIRATORY_TRACT | 12 refills | Status: AC | PRN

## 2017-07-19 MED ORDER — SENNOSIDES-DOCUSATE SODIUM 8.6-50 MG PO TABS: 1.0000 | ORAL_TABLET | Freq: Every evening | ORAL | Status: AC | PRN

## 2017-07-19 MED ORDER — NAPROXEN 250 MG PO TABS: 250.0000 mg | ORAL_TABLET | Freq: Two times a day (BID) | ORAL | Status: AC

## 2017-07-19 NOTE — Progress Notes (Signed)
Physical Therapy Treatment Patient Details Name: Priscilla Ramirez MRN: [id number] DOB: December 10, 1920 Today's Date: 07/19/2017    History of Present Illness 84 y.o. female with medical history significant of hypertension, COPD, Alzheimer's disease, anxiety, CKD-3, who presents with right knee pain and swelling, and right leg swelling, generalized weakness, bradycardia. Pt presenting with right knee pain and swelling. Dopplers negative for DVT. MRI showing medial meniscus tear of RLE.     PT Comments    Pt very distractible and needing consistent cues for redirection.  Emphasis on standing balance and short distance walking.   Follow Up Recommendations  SNF;Supervision/Assistance - 24 hour     Equipment Recommendations  None recommended by PT    Recommendations for Other Services       Precautions / Restrictions Precautions Precautions: Fall Restrictions RLE Weight Bearing: Weight bearing as tolerated    Mobility  Bed Mobility Overal bed mobility: Needs Assistance Bed Mobility: Supine to Sit     Supine to sit: Mod assist     General bed mobility comments: pt needed much less assist when she was motivated to get to EOB.  But today she was impulsive and not safety conscious necessitating holding her back a bit   Transfers Overall transfer level: Needs assistance   Transfers: Sit to/from Stand;Stand Pivot Transfers Sit to Stand: Min assist Stand pivot transfers: Min assist;Mod assist       General transfer comment: pt stood upright with assist, but then start squatting or ?collapsing? in place.    Ambulation/Gait Ambulation/Gait assistance: Min assist;Mod assist Ambulation Distance (Feet): 4 Feet(forward and back x2 with RW) Assistive device: Rolling walker (2 wheeled) Gait Pattern/deviations: Step-through pattern     General Gait Details: staggering gait with need to progressively give pt more lift assist due to patient fatiguing.   Stairs             Wheelchair Mobility    Modified Rankin (Stroke Patients Only)       Balance Overall balance assessment: Needs assistance   Sitting balance-Leahy Scale: Fair Sitting balance - Comments: Reliant on physical A to maintain upright posture; flucuated from minguard to max A for balance; sitting to brush teeth at EOB Postural control: Posterior lean;Left lateral lean   Standing balance-Leahy Scale: Poor Standing balance comment: without support, pt's knees befin to buckle                            Cognition Arousal/Alertness: Awake/alert Behavior During Therapy: WFL for tasks assessed/performed Overall Cognitive Status: History of cognitive impairments - at baseline                                 General Comments: mildly lethargic on arrival, but perked up quickly      Exercises Other Exercises Other Exercises: hip knee flexion/extension ROM exercise with graded resistance bil x 10 reps.    General Comments General comments (skin integrity, edema, etc.): heavy bruising and excoriation on R UE      Pertinent Vitals/Pain Pain Assessment: No/denies pain Pain Intervention(s): Limited activity within patient's tolerance    Home Living Family/patient expects to be discharged to:: Skilled nursing facility                    Prior Function            PT Goals (current goals can now be found  in the care plan section) Acute Rehab PT Goals Patient Stated Goal: Family wanting her to return to PLOF PT Goal Formulation: With patient/family Time For Goal Achievement: 07/31/17 Potential to Achieve Goals: Fair Progress towards PT goals: Progressing toward goals    Frequency    Min 2X/week      PT Plan Current plan remains appropriate    Co-evaluation              AM-PAC PT "6 Clicks" Daily Activity  Outcome Measure  Difficulty turning over in bed (including adjusting bedclothes, sheets and blankets)?: Unable Difficulty moving  from lying on back to sitting on the side of the bed? : Unable Difficulty sitting down on and standing up from a chair with arms (e.g., wheelchair, bedside commode, etc,.)?: A Little Help needed moving to and from a bed to chair (including a wheelchair)?: A Lot Help needed walking in hospital room?: A Lot Help needed climbing 3-5 steps with a railing? : A Lot 6 Click Score: 11    End of Session   Activity Tolerance: Patient limited by fatigue;Other (comment)(loss of focus causing loss of balance/collapsing of joint.) Patient left: in chair;with call bell/phone within reach;with chair alarm set;with family/visitor present Nurse Communication: Mobility status PT Visit Diagnosis: Unsteadiness on feet (R26.81);Muscle weakness (generalized) (M62.81);Pain Pain - Right/Left: Right Pain - part of body: Knee     Time: 1130-1154 PT Time Calculation (min) (ACUTE ONLY): 24 min  Charges:  $Therapeutic Exercise: 8-22 mins $Therapeutic Activity: 8-22 mins                    G Codes:       2017-07-20  Donnella Sham, PT [phone]  (pager)   Priscilla Ramirez 07-20-17, 1:30 PM

## 2017-07-19 NOTE — Clinical Social Work Placement (Signed)
   CLINICAL SOCIAL WORK PLACEMENT  NOTE  Date:  07/19/2017  Patient Details  Name: Priscilla Ramirez MRN: [id number] Date of Birth: 02/11/1921  Clinical Social Work is seeking post-discharge placement for this patient at the [place] level of care (*CSW will initial, date and re-position this form in  chart as items are completed):  Yes   Patient/family provided with [place] Work Department's list of facilities offering this level of care within the geographic area requested by the patient (or if unable, by the patient's family).  Yes   Patient/family informed of their freedom to choose among providers that offer the needed level of care, that participate in Medicare, Medicaid or managed care program needed by the patient, have an available bed and are willing to accept the patient.  Yes   Patient/family informed of [facility]'s ownership interest in [facility] and [facility], as well as of the fact that they are under no obligation to receive care at these facilities.  PASRR submitted to EDS on       PASRR number received on 07/19/17     Existing PASRR number confirmed on       FL2 transmitted to all facilities in geographic area requested by pt/family on 07/18/17     FL2 transmitted to all facilities within larger geographic area on       Patient informed that his/her managed care company has contracts with or will negotiate with certain facilities, including the following:        Yes   Patient/family informed of bed offers received.  Patient chooses bed at [facility]     Physician recommends and patient chooses bed at      Patient to be transferred to [facility] on 07/19/17.  Patient to be transferred to facility by PTAR     Patient family notified on 07/19/17 of transfer.  Name of family member notified:  Dan Humphreys     PHYSICIAN Please sign DNR     Additional Comment:     _______________________________________________ Alexander Mt, [place] 07/19/2017, 12:02 PM

## 2017-07-19 NOTE — Social Work (Addendum)
CSW aware of pt discharge orders and summary. CSW spoke with pt son on phone, he is a little worried about discharge being today. He looked over options with his wife (pt daughter in law) and they have chosen [facility]. CSW f/u with [facility] regarding discharge to see if they have a bed available.   11:59am- [facility] has a bed after 3:00pm- will arrange [facility] per family request to take pt to facility after 3pm. Will f/u with ALF to let them know pt destination.   12:30pm- CSW spoke with pt and pt son at bedside, pt alert and interactive although unable to answer some of CSW questions, pt son supported at bedside. Pt and pt son were interactive and laughing, pt amenable to plan as is pt son. Pt states she looks forward to going back to [place].   12:50pm- Clinical Social Worker facilitated patient discharge including contacting patient family and facility to confirm patient discharge plans.  Clinical information faxed to facility and family agreeable with plan.  CSW arranged ambulance transport via [facility] to [facility] at 3:15pm.   RN to call [phone] with report  prior to discharge.  Clinical Social Worker will sign off for now as social work intervention is no longer needed. Please consult [place] again if new need arises.  Alexander Mt, [place] Social Worker [phone]

## 2017-07-19 NOTE — Progress Notes (Addendum)
0715 Bedside shift report. Pt resting in bed, NAD. Fall precautions in place. WCTM.   0900 Pt medicated per MAR, assessed per flow sheet. Pt alert to self, place, disoriented to time and situation. Pt reoriented. Negative CAM ICU assessment. Pt in low bed, floor mats in place. Compression to right knee and ice pack applied. Pt denies pain, SOB. Updated with POC. Fall precautions in place, [facility].   [place] Son Satomi Buda at bedside. Updated with POC. Pt will be discharged today to rehab. Pt up to chair with physical therapy. Pt denies pain, SOB, no complaints at time. Pt's son educated about MRSA PCR test and positive result. All questions answered. Pt's son understands without assistance. Fall precautions in place, NAD, WCTM.   1515 Report called to [facility] at [facility]. Pt's VSS, pt with some HTN but orders to cover at not within limits. Son at bedside. INT removed without difficulty. Pt dressed and awaiting pick up from transport.   45 Pt's son upset transportation is running late. Asked RN if he could transport pt himself. RN spoke to [place], Therapist, sports and "OK" for son to transport pt. Pt assisted into wheelchair and down to son's car. Paperwork given to son for SNF. Pt had glasses and clothing at time of discharge.

## 2017-07-19 NOTE — Discharge Summary (Addendum)
Physician Discharge Summary  Priscilla Ramirez PYP:[id number] DOB: 02-19-1921 DOA: 07/16/2017  PCP: Merlene Laughter, MD  Admit date: 07/16/2017 Discharge date: 07/19/2017  Admitted From: ALF Disposition:  SNF   Recommendations for Outpatient Follow-up:  1. Stop Prednisone in 2 days and Naproxen in 5 days 2. Bmet in 5 days 3. Encourage oral liquids- must have ~ 1.5 L of water a day due to above meds 4. Continue ACE wrap daily for left knee-    Discharge Condition:  stable   CODE STATUS:  DNR   Diet recommendation:  Regular diet Consultations:  ortho    Discharge Diagnoses:  Principal Problem:   Right knee effusion/ pain- arthritis and possible gout Active Problems:   COPD (chronic obstructive pulmonary disease) (HCC)   Hypertension   Alzheimer's dementia   Right leg swelling   Bradycardia   Physical deconditioning   Anxiety   CKD (chronic kidney disease), stage IV (HCC)  MRA PCR +   Subjective: Quiet forgetful. Has no complaints.   Brief Summary: Priscilla Ramirez is an 84 y.o.female from [place] a PMH of hypertension, COPD, Alzheimer's disease, anxiety, and stage IIIc KD who was admitted 07/16/17 for evaluation of right knee pain and right leg swelling associated with generalized weakness and bradycardia. She has been weak 2 weeks and has not been eating well. Per EMS report, heart rate noted to abruptly decreased to 32 requiring 0.5 mg of atropine and upon arrival to the ED, her heart rate was in the 40s. WBC was 16.1 , O2 saturation of 86% on room air which improved to 100% on oxygen via nasal cannula.    Hospital Course:  Principal Problem:   Right knee pain- per son, the entire right leg was swollen and is getting better - - MRI> Medial meniscus tear- Small to moderate joint effusion and moderate-sized Baker's cyst. Mild tricompartmental degenerative changes. - ortho eval done - suspecting reactive arthritis and started Naproxen 500 BID and ACE wrap- they feel the  meniscus tear is old and does not need management at this time - Uric acid 7.6 - ESR only 27 - can continue prednisone 10 mg daily for 2 more days (started on 4/4) for possibly gout and arthritis -  I have prescribed only 250 of the Naproxen BID for 5 days - Pt and OT recommend SNF vs 24 hr assistance- wheels herself around in a wheelchair at baseline at ALF  Active Problems: Bradycardia/   Hypertension prolonged QT (> 550 on EKG) - due to Metoprolol 100 mg in combination with Aricept - Metoprolol held on admission- HR in 50-60 and dipping down to 40s at times - cont Hydralazine at home dose- follow BP and HR  AKI on CKD stage 3 - stable- Cr 1.7 on admission- improving on IVF to 1.18 today- encourage oral fluids- minimum 1.5 L of water a day     COPD (chronic obstructive pulmonary disease)  - stable-CXR clear- no dyspnea- weaned to room air yesterday - pulse ox 98-100%    Alzheimer's dementia - Aricept    Anxiety - on PRN Ativan BID   Mildly elevated troponin - no chest pain noted- flat trend       Discharge Exam: Vitals:   07/19/17 0803 07/19/17 0808  BP:  (!) 172/77  Pulse: 63 60  Resp: 18 20  Temp:  98 F (36.7 C)  SpO2: 100% 100%   Vitals:   07/19/17 0000 07/19/17 0400 07/19/17 0803 07/19/17 0808  BP: (!) 114/47 Marland Kitchen)  176/55  (!) 172/77  Pulse: 61 (!) 53 63 60  Resp: _0 Temp: 98.4 F (36.9 C) 97.9 F (36.6 C)  98 F (36.7 C)  TempSrc: Oral Oral  Oral  SpO2: 94% 98% 100% 100%  Weight:        General: Pt is alert, awake, not in acute distress, confused but pleasant  Cardiovascular: RRR, S1/S2 +, no rubs, no gallops Respiratory: CTA bilaterally, no wheezing, no rhonchi Abdominal: Soft, NT, ND, bowel sounds + Extremities: no edema, no cyanosis- right knee in ace wrap -    Discharge Instructions  Discharge Instructions    Increase activity slowly   Complete by:  As directed      Allergies as of 07/19/2017   No Known Allergies      Medication List    STOP taking these medications   metoprolol tartrate 100 MG tablet Commonly known as:  LOPRESSOR   traZODone 50 MG tablet Commonly known as:  DESYREL     TAKE these medications   albuterol (2.5 MG/3ML) 0.083% nebulizer solution Commonly known as:  PROVENTIL Take 3 mLs (2.5 mg total) by nebulization every 4 (four) hours as needed for wheezing or shortness of breath.   Calcium 600-400 MG-UNIT Chew Chew 1 tablet by mouth 2 (two) times daily.   cetirizine 10 MG tablet Commonly known as:  ZYRTEC Take 10 mg by mouth at bedtime.   dextromethorphan-guaiFENesin 30-600 MG 12hr tablet Commonly known as:  MUCINEX DM Take 1 tablet by mouth 2 (two) times daily as needed for cough.   donepezil 10 MG tablet Commonly known as:  ARICEPT Take 10 mg by mouth at bedtime.   feeding supplement (ENSURE ENLIVE) Liqd Take 237 mLs by mouth 2 (two) times daily between meals.   hydrALAZINE 25 MG tablet Commonly known as:  APRESOLINE Take 25 mg by mouth 2 (two) times daily.   LORazepam 0.5 MG tablet Commonly known as:  ATIVAN Take 0.5 mg by mouth 2 (two) times daily.   naproxen 500 MG tablet Commonly known as:  NAPROSYN Take 1 tablet (500 mg total) by mouth 2 (two) times daily with a meal. Only for 7 days and then reassess need for this please.   predniSONE 10 MG tablet Commonly known as:  DELTASONE Take 1 tablet (10 mg total) by mouth daily with breakfast. For 2 more days and then stop Start taking on:  07/20/2017   senna-docusate 8.6-50 MG tablet Commonly known as:  Senokot-S Take 1 tablet by mouth at bedtime as needed for mild constipation.       No Known Allergies   Procedures/Studies:   Dg Chest 2 View  Result Date: 07/12/2017 CLINICAL DATA:  Cough EXAM: CHEST - 2 VIEW COMPARISON:  None. FINDINGS: Normal heart size. Aortic atherosclerosis. Normal mediastinal contour. No pneumothorax. No pleural effusion. No pulmonary edema. No acute consolidative airspace  disease. Minimal scarring versus atelectasis at the left lung base. IMPRESSION: Minimal scarring versus atelectasis at the left lung base. Otherwise no active cardiopulmonary disease. Electronically Signed   By: Ilona Sorrel M.D.   On: 07/12/2017 12:05   Dg Pelvis 1-2 Views  Result Date: 07/16/2017 CLINICAL DATA:  Fall EXAM: PELVIS - 1-2 VIEW COMPARISON:  None. FINDINGS: Scoliosis and degenerative changes of the lower lumbar spine. Mild SI joint degenerative changes. Pubic symphysis and rami are intact. Both femoral heads project in joint. No definite acute displaced fracture is seen. There are vascular calcifications. IMPRESSION: Slightly limited evaluation of  femoral necks due to superimposition of the trochanters. No definite acute osseous abnormality is seen Electronically Signed   By: Donavan Foil M.D.   On: 07/16/2017 21:36   Dg Tibia/fibula Right  Result Date: 07/16/2017 CLINICAL DATA:  Fall EXAM: RIGHT TIBIA AND FIBULA - 2 VIEW COMPARISON:  None. FINDINGS: No fracture or malalignment is seen. Vascular calcifications. Small plantar calcaneal spur. Chondrocalcinosis at the knee. IMPRESSION: Soft tissue swelling. No acute osseous abnormality. Chondrocalcinosis at the knee. Electronically Signed   By: Donavan Foil M.D.   On: 07/16/2017 21:37   Ct Head Wo Contrast  Result Date: 07/16/2017 CLINICAL DATA:  Altered mental status. EXAM: CT HEAD WITHOUT CONTRAST TECHNIQUE: Contiguous axial images were obtained from the base of the skull through the vertex without intravenous contrast. COMPARISON:  None. FINDINGS: Brain: Enlargement of the lateral and third ventricles out of proportion to the degree of sulcal enlargement. Crowding of the gyri at the vertex. Prominent periventricular and subcortical white matter hypodensities are nonspecific. No evidence of acute infarction, hemorrhage, extra-axial collection or mass lesion/mass effect. Vascular: Calcified atherosclerosis at the skullbase. No hyperdense  vessel. Skull: Negative for fracture or focal lesion. Sinuses/Orbits: No acute finding. Other: None. IMPRESSION: 1. Enlargement of the lateral and third ventricles out of proportion to the degree of sulcal enlargement/cortical atrophy, suspicious for normal pressure hydrocephalus. Electronically Signed   By: Titus Dubin M.D.   On: 07/16/2017 18:12   Mr Knee Right Wo Contrast  Result Date: 07/17/2017 CLINICAL DATA:  Right knee pain and swelling. EXAM: MRI OF THE RIGHT KNEE WITHOUT CONTRAST TECHNIQUE: Multiplanar, multisequence MR imaging of the knee was performed. No intravenous contrast was administered. COMPARISON:  Radiographs 07/16/2017 FINDINGS: Examination is quite limited as the patient could not hold still. MENISCI Medial meniscus: There is a flap type inferior articular surface tear involving the posterior horn mid body junction region with a small flipped meniscal fragment in the medial gutter. Lateral meniscus:  Degenerative changes without obvious tear. LIGAMENTS Cruciates:  Intact Collaterals:  Intact CARTILAGE Patellofemoral:  Moderate degenerative chondrosis. Medial:  Moderate degenerative chondrosis. Lateral:  Moderate degenerative chondrosis. Joint:  Small to moderate joint effusion. Popliteal Fossa:  Moderate-sized Baker's cyst which may be leaking. Extensor Mechanism: The patella retinacular structures are intact and the quadriceps and patellar tendons are grossly intact. Bones:  No bone contusion, marrow edema or osteochondral lesion. Other: Subcutaneous soft tissue swelling/edema/fluid mainly around the lateral aspect of the knee and lower thigh of uncertain significance or etiology. IMPRESSION: 1. Limited examination. 2. Medial meniscus tear. 3. Intact ligamentous structures and no acute bony findings. 4. Small to moderate joint effusion and moderate-sized Baker's cyst. No MR findings suspicious for septic arthritis or osteomyelitis. 5. Mild tricompartmental degenerative changes.  Electronically Signed   By: Marijo Sanes M.D.   On: 07/17/2017 12:24   Dg Chest Port 1 View  Result Date: 07/16/2017 CLINICAL DATA:  Increasing weakness EXAM: PORTABLE CHEST 1 VIEW COMPARISON:  07/12/2017 FINDINGS: Cardiac shadow is stable. Aortic calcifications are again seen. The lungs are well aerated bilaterally without focal infiltrate or sizable effusion. No acute bony abnormality is noted. IMPRESSION: No active disease. Electronically Signed   By: Inez Catalina M.D.   On: 07/16/2017 19:07     The results of significant diagnostics from this hospitalization (including imaging, microbiology, ancillary and laboratory) are listed below for reference.     Microbiology: Recent Results (from the past 240 hour(s))  Culture, blood (Routine X 2) w Reflex to ID  Panel     Status: None (Preliminary result)   Collection Time: 07/17/17 12:00 AM  Result Value Ref Range Status   Specimen Description BLOOD BLOOD RIGHT FOREARM  Final   Special Requests   Final    BOTTLES DRAWN AEROBIC AND ANAEROBIC Blood Culture adequate volume   Culture   Final    NO GROWTH 1 DAY Performed at [facility], [place] [address], [place], [place] [address]    Report Status PENDING  Incomplete  Culture, blood (Routine X 2) w Reflex to ID Panel     Status: None (Preliminary result)   Collection Time: 07/17/17 12:00 AM  Result Value Ref Range Status   Specimen Description BLOOD BLOOD LEFT FOREARM  Final   Special Requests   Final    BOTTLES DRAWN AEROBIC AND ANAEROBIC Blood Culture adequate volume   Culture   Final    NO GROWTH 1 DAY Performed at [facility], [place] [address], [place], [place] [address]    Report Status PENDING  Incomplete  MRSA PCR Screening     Status: Abnormal   Collection Time: 07/18/17 10:24 PM  Result Value Ref Range Status   MRSA by PCR POSITIVE (A) NEGATIVE Final    Comment:        The GeneXpert MRSA Assay (FDA approved for NASAL specimens only), is one component of  a comprehensive MRSA colonization surveillance program. It is not intended to diagnose MRSA infection nor to guide or monitor treatment for MRSA infections. RESULT CALLED TO, READ BACK BY AND VERIFIED WITH: Oneal Grout RN 07/19/17 0123 JDW Performed at [facility], [address], [place], [place] [address]      Labs: BNP (last 3 results) Recent Labs    07/16/17 2359  BNP 224.8*   Basic Metabolic Panel: Recent Labs  Lab 07/12/17 1139 07/16/17 1750 07/18/17 0520 07/19/17 0735  NA 143 142 141 140  K 4.0 4.4 4.0 3.8  CL 102 105 112* 110  CO2 31 26 21* 23  GLUCOSE 124* 106* 142* 106*  BUN 46* 40* 43* 42*  CREATININE 1.70* 1.61* 1.27* 1.18*  CALCIUM 9.7 9.4 7.7* 8.0*   Liver Function Tests: Recent Labs  Lab 07/12/17 1139 07/16/17 1750  AST 26 49*  ALT 9* 18  ALKPHOS 48 57  BILITOT 0.6 1.1  PROT 6.4* 6.9  ALBUMIN 3.3* 3.1*   No results for input(s): LIPASE, AMYLASE in the last 168 hours. No results for input(s): AMMONIA in the last 168 hours. CBC: Recent Labs  Lab 07/12/17 1139 07/16/17 1750 07/19/17 0735  WBC 13.1* 16.1* 12.4*  NEUTROABS 9.2* 13.2  --   HGB 13.2 13.3 11.4*  HCT 40.6 41.4 36.5  MCV 92.1 91.8 91.7  PLT 265 269 248   Cardiac Enzymes: Recent Labs  Lab 07/16/17 2359 07/17/17 0540  TROPONINI 0.03* 0.03*   BNP: Invalid input(s): POCBNP CBG: No results for input(s): GLUCAP in the last 168 hours. D-Dimer No results for input(s): DDIMER in the last 72 hours. Hgb A1c No results for input(s): HGBA1C in the last 72 hours. Lipid Profile No results for input(s): CHOL, HDL, LDLCALC, TRIG, CHOLHDL, LDLDIRECT in the last 72 hours. Thyroid function studies No results for input(s): TSH, T4TOTAL, T3FREE, THYROIDAB in the last 72 hours.  Invalid input(s): FREET3 Anemia work up No results for input(s): VITAMINB12, FOLATE, FERRITIN, TIBC, IRON, RETICCTPCT in the last 72 hours. Urinalysis    Component Value Date/Time   COLORURINE YELLOW  07/16/2017 2105   APPEARANCEUR CLEAR 07/16/2017 2105   LABSPEC 1.019 07/16/2017 2105   PHURINE 5.0 07/16/2017 2105   GLUCOSEU NEGATIVE 07/16/2017 2105   HGBUR NEGATIVE 07/16/2017 2105   [place] NEGATIVE 07/16/2017 2105   [place] NEGATIVE 07/16/2017 2105   PROTEINUR 100 (A) 07/16/2017 2105   NITRITE NEGATIVE 07/16/2017 2105   LEUKOCYTESUR NEGATIVE 07/16/2017 2105   Sepsis Labs Invalid input(s): PROCALCITONIN,  WBC,  LACTICIDVEN Microbiology Recent Results (from the past 240 hour(s))  Culture, blood (Routine X 2) w Reflex to ID Panel     Status: None (Preliminary result)   Collection Time: 07/17/17 12:00 AM  Result Value Ref Range Status   Specimen Description BLOOD BLOOD RIGHT FOREARM  Final   Special Requests   Final    BOTTLES DRAWN AEROBIC AND ANAEROBIC Blood Culture adequate volume   Culture   Final    NO GROWTH 1 DAY Performed at [facility], [place] [address] [place], [place] [address]    Report Status PENDING  Incomplete  Culture, blood (Routine X 2) w Reflex to ID Panel     Status: None (Preliminary result)   Collection Time: 07/17/17 12:00 AM  Result Value Ref Range Status   Specimen Description BLOOD BLOOD LEFT FOREARM  Final   Special Requests   Final    BOTTLES DRAWN AEROBIC AND ANAEROBIC Blood Culture adequate volume   Culture   Final    NO GROWTH 1 DAY Performed at [facility], [place] [address] [place], [place] [address]    Report Status PENDING  Incomplete  MRSA PCR Screening     Status: Abnormal   Collection Time: 07/18/17 10:24 PM  Result Value Ref Range Status   MRSA by PCR POSITIVE (A) NEGATIVE Final    Comment:        The GeneXpert MRSA Assay (FDA approved for NASAL specimens only), is one component of a comprehensive MRSA colonization surveillance program. It is not intended to diagnose MRSA infection nor to guide or monitor treatment for MRSA infections. RESULT CALLED TO, READ BACK BY AND VERIFIED WITH: Oneal Grout RN 07/19/17  0123 JDW Performed at [facility], [address], [place], [place] [address]      Time coordinating discharge: 84  SIGNED:   Debbe Odea, MD  [facility] 07/19/2017, 10:11 AM Pager   If 7PM-7AM, please contact night-coverage [email or web address] Password [email or web address]

## 2017-07-22 LAB — CULTURE, BLOOD (ROUTINE X 2)
CULTURE: NO GROWTH
Culture: NO GROWTH
SPECIAL REQUESTS: ADEQUATE
Special Requests: ADEQUATE

## 2018-11-09 ENCOUNTER — Other Ambulatory Visit: Payer: Self-pay

## 2018-11-09 ENCOUNTER — Emergency Department (HOSPITAL_COMMUNITY)
Admission: EM | Admit: 2018-11-09 | Discharge: 2018-11-09 | Disposition: A | Discharge status: Home / Self Care | Payer: Medicare Other | Attending: Emergency Medicine | Admitting: Emergency Medicine

## 2018-11-09 ENCOUNTER — Encounter (HOSPITAL_COMMUNITY): Payer: Self-pay | Admitting: Emergency Medicine

## 2018-11-09 DIAGNOSIS — Z79899 Other long term (current) drug therapy: Secondary | ICD-10-CM | POA: Diagnosis not present

## 2018-11-09 DIAGNOSIS — W19XXXA Unspecified fall, initial encounter: Secondary | ICD-10-CM

## 2018-11-09 DIAGNOSIS — N184 Chronic kidney disease, stage 4 (severe): Secondary | ICD-10-CM | POA: Insufficient documentation

## 2018-11-09 DIAGNOSIS — Y999 Unspecified external cause status: Secondary | ICD-10-CM | POA: Insufficient documentation

## 2018-11-09 DIAGNOSIS — I129 Hypertensive chronic kidney disease with stage 1 through stage 4 chronic kidney disease, or unspecified chronic kidney disease: Secondary | ICD-10-CM | POA: Insufficient documentation

## 2018-11-09 DIAGNOSIS — Y92129 Unspecified place in nursing home as the place of occurrence of the external cause: Secondary | ICD-10-CM | POA: Insufficient documentation

## 2018-11-09 DIAGNOSIS — Y939 Activity, unspecified: Secondary | ICD-10-CM | POA: Diagnosis not present

## 2018-11-09 DIAGNOSIS — Z87891 Personal history of nicotine dependence: Secondary | ICD-10-CM | POA: Insufficient documentation

## 2018-11-09 DIAGNOSIS — J449 Chronic obstructive pulmonary disease, unspecified: Secondary | ICD-10-CM | POA: Insufficient documentation

## 2018-11-09 DIAGNOSIS — S0990XA Unspecified injury of head, initial encounter: Secondary | ICD-10-CM | POA: Diagnosis present

## 2018-11-09 DIAGNOSIS — S0181XA Laceration without foreign body of other part of head, initial encounter: Secondary | ICD-10-CM | POA: Insufficient documentation

## 2018-11-09 DIAGNOSIS — W050XXA Fall from non-moving wheelchair, initial encounter: Secondary | ICD-10-CM | POA: Insufficient documentation

## 2018-11-09 NOTE — ED Notes (Signed)
Pt given d/c instructions;

## 2018-11-09 NOTE — ED Notes (Addendum)
2 RNs and EMT attempted to ambulate/ bear weight and pt couldn't bear weight or ambulate.

## 2018-11-09 NOTE — Discharge Instructions (Addendum)
Discussed care with son, Latorie Montesano, and in agreement with no ct. Laceration will require wound care without sutures

## 2018-11-09 NOTE — ED Provider Notes (Signed)
[facility] EMERGENCY DEPARTMENT Provider Note   CSN: [id number] Arrival date & time: 11/09/18  1042     History   Chief Complaint No chief complaint on file.   HPI Priscilla Ramirez is a 85 y.o. female.     HPI  Level 5 caveat secondary to dementia 85 year old female brought in via EMS from [facility] living facility.  Per nursing report patient slid out of her wheelchair and bumped the left side of her head.  She also has a bruise to her knee.  Patient is unaware of where she is or where she lives.  She does not remember falling.  She denies having any pain at this time. [place] at [phone] to speak with anybody and message left Attempted to contact son Jeneen Rinks at #[phone] and [phone] Message left on both lines    Past Medical History:  Diagnosis Date  . Alzheimer's dementia ([place])   . COPD (chronic obstructive pulmonary disease) ([place])   . Hypertension     Patient Active Problem List   Diagnosis Date Noted  . CKD (chronic kidney disease), stage IV ([place]) 07/17/2017  . Right leg swelling 07/16/2017  . Leukocytosis 07/16/2017  . Bradycardia 07/16/2017  . Physical deconditioning 07/16/2017  . Right knee pain 07/16/2017  . Anxiety 07/16/2017  . COPD (chronic obstructive pulmonary disease) ([place])   . Hypertension   . Alzheimer's dementia ([place])   . Weakness     History reviewed. No pertinent surgical history.   OB History   No obstetric history on file.      Home Medications    Prior to Admission medications   Medication Sig Start Date End Date Taking? Authorizing Provider  albuterol (PROVENTIL) (2.5 MG/3ML) 0.083% nebulizer solution Take 3 mLs (2.5 mg total) by nebulization every 4 (four) hours as needed for wheezing or shortness of breath. 07/19/17   Debbe Odea, MD  Calcium 600-400 MG-UNIT CHEW Chew 1 tablet by mouth 2 (two) times daily.    [provider]  cetirizine (ZYRTEC) 10 MG tablet Take  10 mg by mouth at bedtime.     [provider]  dextromethorphan-guaiFENesin (MUCINEX DM) 30-600 MG 12hr tablet Take 1 tablet by mouth 2 (two) times daily as needed for cough. 07/19/17   Debbe Odea, MD  donepezil (ARICEPT) 10 MG tablet Take 10 mg by mouth at bedtime.    [provider]  feeding supplement, ENSURE ENLIVE, (ENSURE ENLIVE) LIQD Take 237 mLs by mouth 2 (two) times daily between meals. 07/19/17   Debbe Odea, MD  hydrALAZINE (APRESOLINE) 25 MG tablet Take 25 mg by mouth 2 (two) times daily.    [provider]  LORazepam (ATIVAN) 0.5 MG tablet Take 0.5 mg by mouth 2 (two) times daily.    [provider]  naproxen (NAPROSYN) 250 MG tablet Take 1 tablet (250 mg total) by mouth 2 (two) times daily with a meal. Only for 5 days and then reassess need for this please. 07/19/17   Debbe Odea, MD  predniSONE (DELTASONE) 10 MG tablet Take 1 tablet (10 mg total) by mouth daily with breakfast. For 2 more days and then stop 07/20/17   Debbe Odea, MD  senna-docusate (SENOKOT-S) 8.6-50 MG tablet Take 1 tablet by mouth at bedtime as needed for mild constipation. 07/19/17   Debbe Odea, MD    Family History Family History  Problem Relation Age of Onset  . Heart attack Mother   . Cancer Father  Social History Social History   Tobacco Use  . Smoking status: Former Smoker    Types: Cigarettes  . Smokeless tobacco: Never Used  Substance Use Topics  . Alcohol use: Never    Frequency: Never  . Drug use: Not Currently     Allergies   Patient has no known allergies.   Review of Systems Review of Systems  Unable to perform ROS: Dementia     Physical Exam Updated Vital Signs BP (!) 179/71   Temp 97.9 F (36.6 C) (Oral)   Physical Exam Vitals signs and nursing note reviewed.  Constitutional:      General: She is not in acute distress.    Appearance: Normal appearance. She is not ill-appearing.  HENT:     Head: Normocephalic.      Comments: Abrasion left side of forehead with 0.25 cm laceration superior lateral to left eyebrow    Right Ear: External ear normal.     Left Ear: External ear normal.     Nose: Nose normal.     Mouth/Throat:     Mouth: Mucous membranes are moist.  Eyes:     Extraocular Movements: Extraocular movements intact.     Pupils: Pupils are equal, round, and reactive to light.  Neck:     Musculoskeletal: Normal range of motion and neck supple.  Cardiovascular:     Rate and Rhythm: Normal rate and regular rhythm.     Pulses: Normal pulses.     Heart sounds: Normal heart sounds.  Pulmonary:     Effort: Pulmonary effort is normal.     Breath sounds: Normal breath sounds.  Abdominal:     General: Abdomen is flat.     Palpations: Abdomen is soft.  Musculoskeletal: Normal range of motion.        General: No swelling or tenderness.     Comments: Abrasion left anterior knee  Skin:    General: Skin is warm and dry.     Capillary Refill: Capillary refill takes less than 2 seconds.  Neurological:     General: No focal deficit present.     Mental Status: She is alert.     Cranial Nerves: No cranial nerve deficit.     Motor: No weakness.     Coordination: Coordination normal.      ED Treatments / Results  Labs (all labs ordered are listed, but only abnormal results are displayed) Labs Reviewed - No data to display  EKG None  Radiology No results found.  Procedures Procedures (including critical care time)  Medications Ordered in ED Medications - No data to display   Initial Impression / Assessment and Plan / ED Course  I have reviewed the triage vital signs and the nursing notes.  Pertinent labs & imaging results that were available during my care of the patient were reviewed by me and considered in my medical decision making (see chart for details).       Discussed with son, Tyarra Nolton, and he advises no CT or further evaluation.  Patient with only significant trauma is the  laceration to her head.  This does not require sutures.  She has some bruises of different age on her extremities but has no point tenderness and is moving everything well.  She appears stable for discharge  Final Clinical Impressions(s) / ED Diagnoses   Final diagnoses:  Fall, initial encounter  Facial laceration, initial encounter    ED Discharge Orders    None  Pattricia Boss, MD 11/09/18 1226

## 2018-11-09 NOTE — ED Triage Notes (Addendum)
Pt slipped out of the wheelchair and bumped her head left side. Pt also has abrasion on left knee with edema. Denies being on blood thinners. Hx of copd. Hx of bradycardia. Baseline  Dementia.

## 2018-12-26 ENCOUNTER — Other Ambulatory Visit: Payer: Self-pay

## 2018-12-26 ENCOUNTER — Emergency Department (HOSPITAL_COMMUNITY): Payer: Medicare Other

## 2018-12-26 ENCOUNTER — Emergency Department (HOSPITAL_COMMUNITY)
Admission: EM | Admit: 2018-12-26 | Discharge: 2018-12-26 | Disposition: A | Discharge status: Home / Self Care | Payer: Medicare Other | Attending: Emergency Medicine | Admitting: Emergency Medicine

## 2018-12-26 DIAGNOSIS — J449 Chronic obstructive pulmonary disease, unspecified: Secondary | ICD-10-CM | POA: Insufficient documentation

## 2018-12-26 DIAGNOSIS — Y92129 Unspecified place in nursing home as the place of occurrence of the external cause: Secondary | ICD-10-CM | POA: Diagnosis not present

## 2018-12-26 DIAGNOSIS — Y939 Activity, unspecified: Secondary | ICD-10-CM | POA: Insufficient documentation

## 2018-12-26 DIAGNOSIS — S63260A Dislocation of metacarpophalangeal joint of right index finger, initial encounter: Secondary | ICD-10-CM | POA: Insufficient documentation

## 2018-12-26 DIAGNOSIS — I129 Hypertensive chronic kidney disease with stage 1 through stage 4 chronic kidney disease, or unspecified chronic kidney disease: Secondary | ICD-10-CM | POA: Insufficient documentation

## 2018-12-26 DIAGNOSIS — N184 Chronic kidney disease, stage 4 (severe): Secondary | ICD-10-CM | POA: Insufficient documentation

## 2018-12-26 DIAGNOSIS — Z79899 Other long term (current) drug therapy: Secondary | ICD-10-CM | POA: Diagnosis not present

## 2018-12-26 DIAGNOSIS — S6991XA Unspecified injury of right wrist, hand and finger(s), initial encounter: Secondary | ICD-10-CM | POA: Diagnosis present

## 2018-12-26 DIAGNOSIS — R296 Repeated falls: Secondary | ICD-10-CM

## 2018-12-26 DIAGNOSIS — F0280 Dementia in other diseases classified elsewhere without behavioral disturbance: Secondary | ICD-10-CM | POA: Insufficient documentation

## 2018-12-26 DIAGNOSIS — G309 Alzheimer's disease, unspecified: Secondary | ICD-10-CM | POA: Insufficient documentation

## 2018-12-26 DIAGNOSIS — S0081XA Abrasion of other part of head, initial encounter: Secondary | ICD-10-CM | POA: Diagnosis not present

## 2018-12-26 DIAGNOSIS — S63259A Unspecified dislocation of unspecified finger, initial encounter: Secondary | ICD-10-CM

## 2018-12-26 DIAGNOSIS — W010XXA Fall on same level from slipping, tripping and stumbling without subsequent striking against object, initial encounter: Secondary | ICD-10-CM | POA: Diagnosis not present

## 2018-12-26 DIAGNOSIS — Y999 Unspecified external cause status: Secondary | ICD-10-CM | POA: Insufficient documentation

## 2018-12-26 DIAGNOSIS — T07XXXA Unspecified multiple injuries, initial encounter: Secondary | ICD-10-CM

## 2018-12-26 DIAGNOSIS — Z87891 Personal history of nicotine dependence: Secondary | ICD-10-CM | POA: Insufficient documentation

## 2018-12-26 MED ORDER — LIDOCAINE HCL (PF) 1 % IJ SOLN
5.0000 mL | Freq: Once | INTRAMUSCULAR | Status: AC
  Administered 2018-12-26: 5 mL via INTRADERMAL
  Filled 2018-12-26: qty 5

## 2018-12-26 MED ORDER — LORAZEPAM 2 MG/ML IJ SOLN
0.5000 mg | Freq: Once | INTRAMUSCULAR | Status: AC
  Administered 2018-12-26: 23:00:00 0.5 mg via INTRAVENOUS
  Filled 2018-12-26: qty 1

## 2018-12-26 MED ORDER — ACETAMINOPHEN 325 MG PO TABS
650.0000 mg | ORAL_TABLET | Freq: Once | ORAL | Status: AC
  Administered 2018-12-26: 650 mg via ORAL
  Filled 2018-12-26: qty 2

## 2018-12-26 MED ORDER — FENTANYL CITRATE (PF) 100 MCG/2ML IJ SOLN
50.0000 ug | Freq: Once | INTRAMUSCULAR | Status: AC
  Administered 2018-12-26: 23:00:00 50 ug via INTRAVENOUS
  Filled 2018-12-26: qty 2

## 2018-12-26 NOTE — Discharge Instructions (Signed)
Please keep the patient's finger splinted for  1 week.  Use Tylenol for pain.  Follow-up with primary care doctor.

## 2018-12-26 NOTE — ED Notes (Signed)
Notified [facility] for transportation back to [facility]

## 2018-12-26 NOTE — ED Provider Notes (Signed)
[facility] EMERGENCY DEPARTMENT Provider Note   CSN: [id number] Arrival date & time: 12/26/18  1827     History   Chief Complaint Chief Complaint  Patient presents with  . Fall    HPI Priscilla Ramirez is a 85 y.o. female's after mechanical fall from her skilled nursing facility.  There is a level 5 caveat due to advanced dementia.  EMS reports that the patient had a mechanical fall.  She has an obvious right hand deformity.  Fall was apparently unwitnessed but she does have some abrasions noted to the face.  Patient from falls frequently.  I spoke with the patient's son, Priscilla Ramirez and discussed the goals of care.  He asked that we image the hand deformity but declines any further work-up including lab work or further imaging. as he feels they would not do any kind of intervention   HPI  Past Medical History:  Diagnosis Date  . Alzheimer's dementia ([place])   . COPD (chronic obstructive pulmonary disease) ([place])   . Hypertension     Patient Active Problem List   Diagnosis Date Noted  . CKD (chronic kidney disease), stage IV ([place]) 07/17/2017  . Right leg swelling 07/16/2017  . Leukocytosis 07/16/2017  . Bradycardia 07/16/2017  . Physical deconditioning 07/16/2017  . Right knee pain 07/16/2017  . Anxiety 07/16/2017  . COPD (chronic obstructive pulmonary disease) ([place])   . Hypertension   . Alzheimer's dementia ([place])   . Weakness     No past surgical history on file.   OB History   No obstetric history on file.      Home Medications    Prior to Admission medications   Medication Sig Start Date End Date Taking? Authorizing Provider  albuterol (PROVENTIL) (2.5 MG/3ML) 0.083% nebulizer solution Take 3 mLs (2.5 mg total) by nebulization every 4 (four) hours as needed for wheezing or shortness of breath. 07/19/17   Debbe Odea, MD  Calcium 600-400 MG-UNIT CHEW Chew 1 tablet by mouth 2 (two) times daily.    [provider]  cetirizine (ZYRTEC) 10  MG tablet Take 10 mg by mouth at bedtime.     [provider]  dextromethorphan-guaiFENesin (MUCINEX DM) 30-600 MG 12hr tablet Take 1 tablet by mouth 2 (two) times daily as needed for cough. 07/19/17   Debbe Odea, MD  donepezil (ARICEPT) 10 MG tablet Take 10 mg by mouth at bedtime.    [provider]  feeding supplement, ENSURE ENLIVE, (ENSURE ENLIVE) LIQD Take 237 mLs by mouth 2 (two) times daily between meals. 07/19/17   Debbe Odea, MD  hydrALAZINE (APRESOLINE) 25 MG tablet Take 25 mg by mouth 2 (two) times daily.    [provider]  LORazepam (ATIVAN) 0.5 MG tablet Take 0.5 mg by mouth 2 (two) times daily.    [provider]  naproxen (NAPROSYN) 250 MG tablet Take 1 tablet (250 mg total) by mouth 2 (two) times daily with a meal. Only for 5 days and then reassess need for this please. 07/19/17   Debbe Odea, MD  predniSONE (DELTASONE) 10 MG tablet Take 1 tablet (10 mg total) by mouth daily with breakfast. For 2 more days and then stop 07/20/17   Debbe Odea, MD  senna-docusate (SENOKOT-S) 8.6-50 MG tablet Take 1 tablet by mouth at bedtime as needed for mild constipation. 07/19/17   Debbe Odea, MD    Family History Family History  Problem Relation Age of Onset  . Heart attack Mother   .  Cancer Father     Social History Social History   Tobacco Use  . Smoking status: Former Smoker    Types: Cigarettes  . Smokeless tobacco: Never Used  Substance Use Topics  . Alcohol use: Never    Frequency: Never  . Drug use: Not Currently     Allergies   Patient has no known allergies.   Review of Systems Review of Systems  Unable to review systems secondary to Alzheimer's dementia Physical Exam Updated Vital Signs BP 132/73   Pulse 100   Temp 98.9 F (37.2 C) (Oral)   Resp 16   SpO2 96%   Physical Exam Vitals signs and nursing note reviewed.  Constitutional:      General: She is not in acute distress.    Appearance: She is well-developed.  She is not diaphoretic.  HENT:     Head: Normocephalic.     Comments: Abrasions to the face Eyes:     General: No scleral icterus.    Conjunctiva/sclera: Conjunctivae normal.  Neck:     Musculoskeletal: Normal range of motion.  Cardiovascular:     Rate and Rhythm: Normal rate and regular rhythm.     Heart sounds: Normal heart sounds. No murmur. No friction rub. No gallop.   Pulmonary:     Effort: Pulmonary effort is normal. No respiratory distress.     Breath sounds: Normal breath sounds.  Abdominal:     General: Bowel sounds are normal. There is no distension.     Palpations: Abdomen is soft. There is no mass.     Tenderness: There is no abdominal tenderness. There is no guarding.  Musculoskeletal:     Comments: Right hand with deformity of the second digit  Skin:    General: Skin is warm and dry.  Neurological:     Mental Status: She is alert and oriented to person, place, and time.  Psychiatric:        Behavior: Behavior normal.      ED Treatments / Results  Labs (all labs ordered are listed, but only abnormal results are displayed) Labs Reviewed - No data to display  EKG None  Radiology No results found.  Procedures Reduction of dislocation  Date/Time: 12/27/2018 12:02 AM Performed by: Margarita Mail, PA-C Authorized by: Margarita Mail, PA-C  Consent: Verbal consent obtained. Consent given by: guardian Patient identity confirmed: provided demographic data Local anesthesia used: yes Anesthesia: hematoma block  Anesthesia: Local anesthesia used: yes Local Anesthetic: lidocaine 1% without epinephrine  Sedation: Patient sedated: no  Patient tolerance: patient tolerated the procedure well with no immediate complications  .Splint Application  Date/Time: 12/27/2018 12:03 AM Performed by: Margarita Mail, PA-C Authorized by: Margarita Mail, PA-C   Procedure details:    Laterality:  Right   Location:  Finger   Finger:  R index finger   Splint type:   Finger   Supplies:  Aluminum splint Post-procedure details:    Pain:  Improved   Sensation:  Normal   Patient tolerance of procedure:  Tolerated well, no immediate complications   (including critical care time)  Medications Ordered in ED Medications - No data to display   Initial Impression / Assessment and Plan / ED Course  I have reviewed the triage vital signs and the nursing notes.  Pertinent labs & imaging results that were available during my care of the patient were reviewed by me and considered in my medical decision making (see chart for details).  Patient with finger dislocation.  I personally reduced the dislocation and felt the joint pop back into place with good alignment.  Patient placed in finger splint which I applied.  Discussed the case with Dr. Alvino Chapel do not feel that she needs a reduction film as it was very obvious successful reduction and given the scope of treatment advised by the son did not feel that this was appropriate care.  Patient neurovascularly intact after splint application.  Appropriate for discharge back to her care facility.  Seen and shared visit with Dr. Alvino Chapel.  Final Clinical Impressions(s) / ED Diagnoses   Final diagnoses:  None    ED Discharge Orders    None       Margarita Mail, PA-C 12/27/18 0005    Davonna Belling, MD 12/27/18 [id number]

## 2018-12-26 NOTE — ED Triage Notes (Signed)
Per ems pt is from [place] on [address]. Pt has hx of numerous falls. Pt had and unwitnessed fall today and was found face first and was placed in a chair. Facility noticed right index finger deformity and face laceration to the forehead and down the bridge of the nose. Pt is  DNR. 206/88, 99 HR, 16 RR, 97% RA, CBG 162

## 2018-12-26 NOTE — ED Notes (Signed)
Lidocaine at bedside and suture cart at room door.

## 2018-12-28 ENCOUNTER — Emergency Department (HOSPITAL_COMMUNITY)
Admission: EM | Admit: 2018-12-28 | Discharge: 2018-12-28 | Disposition: A | Discharge status: Home / Self Care | Payer: Medicare Other | Attending: Emergency Medicine | Admitting: Emergency Medicine

## 2018-12-28 ENCOUNTER — Emergency Department (HOSPITAL_COMMUNITY): Payer: Medicare Other

## 2018-12-28 ENCOUNTER — Other Ambulatory Visit: Payer: Self-pay

## 2018-12-28 ENCOUNTER — Encounter (HOSPITAL_COMMUNITY): Payer: Self-pay | Admitting: Emergency Medicine

## 2018-12-28 DIAGNOSIS — I129 Hypertensive chronic kidney disease with stage 1 through stage 4 chronic kidney disease, or unspecified chronic kidney disease: Secondary | ICD-10-CM | POA: Diagnosis not present

## 2018-12-28 DIAGNOSIS — N184 Chronic kidney disease, stage 4 (severe): Secondary | ICD-10-CM | POA: Diagnosis not present

## 2018-12-28 DIAGNOSIS — R2231 Localized swelling, mass and lump, right upper limb: Secondary | ICD-10-CM | POA: Insufficient documentation

## 2018-12-28 DIAGNOSIS — W07XXXA Fall from chair, initial encounter: Secondary | ICD-10-CM | POA: Insufficient documentation

## 2018-12-28 DIAGNOSIS — J449 Chronic obstructive pulmonary disease, unspecified: Secondary | ICD-10-CM | POA: Insufficient documentation

## 2018-12-28 DIAGNOSIS — Z79899 Other long term (current) drug therapy: Secondary | ICD-10-CM | POA: Insufficient documentation

## 2018-12-28 DIAGNOSIS — W19XXXA Unspecified fall, initial encounter: Secondary | ICD-10-CM

## 2018-12-28 DIAGNOSIS — G309 Alzheimer's disease, unspecified: Secondary | ICD-10-CM | POA: Diagnosis not present

## 2018-12-28 LAB — CBG MONITORING, ED: Glucose-Capillary: 88 mg/dL (ref 70–99)

## 2018-12-28 NOTE — Discharge Instructions (Signed)
Please do not tightly wrap your hand as that could lead to worsening injury.  Feel free to tape the 2 involved fingers together.  It is our understanding that you do not want any aggressive measures taken.  There is a way that you can fill out paperwork is called a MOST form.  There is a way that you can fill it out so that if you are not uncomfortable then you should not be transported against your wishes.

## 2018-12-28 NOTE — ED Notes (Signed)
[facility] called for transport.  

## 2018-12-28 NOTE — ED Triage Notes (Signed)
Pt BIB [facility] from [place] on [address]. Pt fell this morning from a chair and hit her head on the wall. Pt was discharged from here yesterday for a fall and broken fingers. Apparent swelling to the right hand from how the facility wrapped her hand. VSS.

## 2018-12-28 NOTE — ED Notes (Signed)
Pt unable to sign due to broken fingers. Discharge summary reviewed. Pt discharged.

## 2018-12-28 NOTE — ED Provider Notes (Signed)
[facility] EMERGENCY DEPARTMENT Provider Note   CSN: [id number] Arrival date & time: 12/28/18  1215     History   Chief Complaint Chief Complaint  Patient presents with  . Fall    HPI Priscilla Ramirez is a 85 y.o. female.     85 yo F with a chief complaint of a fall.  Per nursing staff she slid out of a chair and landed on her face.  Patient is demented.  Unable to provide further history.  Level 5 caveat dementia  The history is provided by the patient and a relative.  Fall This is a recurrent problem. The current episode started less than 1 hour ago. The problem occurs constantly. The problem has not changed since onset.Nothing aggravates the symptoms. Nothing relieves the symptoms. She has tried nothing for the symptoms.    Past Medical History:  Diagnosis Date  . Alzheimer's dementia ([place])   . COPD (chronic obstructive pulmonary disease) ([place])   . Hypertension     Patient Active Problem List   Diagnosis Date Noted  . CKD (chronic kidney disease), stage IV ([place]) 07/17/2017  . Right leg swelling 07/16/2017  . Leukocytosis 07/16/2017  . Bradycardia 07/16/2017  . Physical deconditioning 07/16/2017  . Right knee pain 07/16/2017  . Anxiety 07/16/2017  . COPD (chronic obstructive pulmonary disease) ([place])   . Hypertension   . Alzheimer's dementia ([place])   . Weakness     No past surgical history on file.   OB History   No obstetric history on file.      Home Medications    Prior to Admission medications   Medication Sig Start Date End Date Taking? Authorizing Provider  albuterol (PROVENTIL) (2.5 MG/3ML) 0.083% nebulizer solution Take 3 mLs (2.5 mg total) by nebulization every 4 (four) hours as needed for wheezing or shortness of breath. 07/19/17   Debbe Odea, MD  Calcium 600-400 MG-UNIT CHEW Chew 1 tablet by mouth 2 (two) times daily.    [provider]  cetirizine (ZYRTEC) 10 MG tablet Take 10 mg by mouth at bedtime.     [provider]  dextromethorphan-guaiFENesin (MUCINEX DM) 30-600 MG 12hr tablet Take 1 tablet by mouth 2 (two) times daily as needed for cough. 07/19/17   Debbe Odea, MD  donepezil (ARICEPT) 10 MG tablet Take 10 mg by mouth at bedtime.    [provider]  feeding supplement, ENSURE ENLIVE, (ENSURE ENLIVE) LIQD Take 237 mLs by mouth 2 (two) times daily between meals. 07/19/17   Debbe Odea, MD  hydrALAZINE (APRESOLINE) 25 MG tablet Take 25 mg by mouth 2 (two) times daily.    [provider]  LORazepam (ATIVAN) 0.5 MG tablet Take 0.5 mg by mouth 2 (two) times daily.    [provider]  naproxen (NAPROSYN) 250 MG tablet Take 1 tablet (250 mg total) by mouth 2 (two) times daily with a meal. Only for 5 days and then reassess need for this please. 07/19/17   Debbe Odea, MD  predniSONE (DELTASONE) 10 MG tablet Take 1 tablet (10 mg total) by mouth daily with breakfast. For 2 more days and then stop 07/20/17   Debbe Odea, MD  senna-docusate (SENOKOT-S) 8.6-50 MG tablet Take 1 tablet by mouth at bedtime as needed for mild constipation. 07/19/17   Debbe Odea, MD    Family History Family History  Problem Relation Age of Onset  . Heart attack Mother   . Cancer Father     Social History  Social History   Tobacco Use  . Smoking status: Former Smoker    Types: Cigarettes  . Smokeless tobacco: Never Used  Substance Use Topics  . Alcohol use: Never    Frequency: Never  . Drug use: Not Currently     Allergies   Patient has no known allergies.   Review of Systems Review of Systems  Unable to perform ROS: Dementia     Physical Exam Updated Vital Signs BP (!) 160/85 (BP Location: Left Arm)   Pulse 96   Temp 97.9 F (36.6 C) (Oral)   Resp 16   SpO2 97%   Physical Exam Vitals signs and nursing note reviewed.  Constitutional:      General: She is not in acute distress.    Appearance: She is well-developed. She is not diaphoretic.  HENT:     Head:  Normocephalic.     Comments: Periorbital hematoma bilaterally Eyes:     Pupils: Pupils are equal, round, and reactive to light.  Neck:     Musculoskeletal: Normal range of motion and neck supple.  Cardiovascular:     Rate and Rhythm: Normal rate and regular rhythm.     Heart sounds: No murmur. No friction rub. No gallop.   Pulmonary:     Effort: Pulmonary effort is normal.     Breath sounds: No wheezing or rales.  Abdominal:     General: There is no distension.     Palpations: Abdomen is soft.     Tenderness: There is no abdominal tenderness.  Musculoskeletal:        General: No tenderness.     Comments: Contractures to all 4 extremities significant kyphosis  Skin:    General: Skin is warm and dry.  Neurological:     Mental Status: She is alert and oriented to person, place, and time.  Psychiatric:        Behavior: Behavior normal.      ED Treatments / Results  Labs (all labs ordered are listed, but only abnormal results are displayed) Labs Reviewed  CBG MONITORING, ED    EKG None  Radiology Dg Hand 2 View Right  Result Date: 12/28/2018 CLINICAL DATA:  Right hand swelling.  The patient fell 2 days ago. EXAM: RIGHT HAND - 2 VIEW COMPARISON:  Radiographs dated 12/26/2018 FINDINGS: The patient had a dislocation at the second MCP joint on the prior study. Dislocation has been reduced. There is no fracture. The patient has developed prominent dorsal soft tissue swelling over the MCP joints and over the proximal hand. There are fairly severe arthritic changes in the radial aspect of the wrist and at the first [facility] joint. Chronic osteoarthritic changes of the IP joints of the fingers. IMPRESSION: No acute abnormality of the bones. Reduction of the dislocation noted on the prior study. New soft tissue swelling. Electronically Signed   By: Lorriane Shire M.D.   On: 12/28/2018 13:26   Dg Hand Complete Right  Result Date: 12/26/2018 CLINICAL DATA:  85 year old female with fall and  deformity of the right index finger. EXAM: RIGHT HAND - COMPLETE 3+ VIEW COMPARISON:  None. FINDINGS: There is dorsal subluxation of the MCP joint of the index finger. There is flexed PIP of the index finger. No definite acute fracture identified. The bones are osteopenic. There is arthritic changes of the base of the thumb. The soft tissues are grossly unremarkable. IMPRESSION: Dorsal subluxation of the MCP joint of the index finger. No definite acute fracture identified. Electronically Signed  By: Anner Crete M.D.   On: 12/26/2018 19:35    Procedures Procedures (including critical care time)  Medications Ordered in ED Medications - No data to display   Initial Impression / Assessment and Plan / ED Course  I have reviewed the triage vital signs and the nursing notes.  Pertinent labs & imaging results that were available during my care of the patient were reviewed by me and considered in my medical decision making (see chart for details).        85 yo F with a chief complaint of a fall from a chair.  The patient is nonambulatory at baseline and was seated in a chair when she fell out.  Patient is demented and unable to provide further history.  I discussed the case with the son who at this point in her life declines imaging.  He is concerned about her right hand which was seen a couple days ago when he had heard that it was a bit more swollen.  I will obtain a plain film to evaluate.  Old records reviewed and the patient had a dislocation that was reduced in the ED.  Plain film viewed by me without fracture.  Dislocation is reduced from prior visit.  We will discharge the patient home at this time.  Have her follow-up with her family doctor.  1:36 PM:  I have discussed the diagnosis/risks/treatment options with the patient and believe the pt to be eligible for discharge home to follow-up with PCP. We also discussed returning to the ED immediately if new or worsening sx occur. We  discussed the sx which are most concerning (e.g., sudden worsening pain, fever, inability to tolerate by mouth) that necessitate immediate return. Medications administered to the patient during their visit and any new prescriptions provided to the patient are listed below.  Medications given during this visit Medications - No data to display   The patient appears reasonably screen and/or stabilized for discharge and I doubt any other medical condition or other [facility] requiring further screening, evaluation, or treatment in the ED at this time prior to discharge.    Final Clinical Impressions(s) / ED Diagnoses   Final diagnoses:  Fall, initial encounter    ED Discharge Orders    None       Deno Etienne, DO 12/28/18 1336

## 2019-03-09 IMAGING — DX DG PELVIS 1-2V
2 series · 2 of 2 positions shown · non-contrast
Comparison: None.

CLINICAL DATA: Fall

EXAM:
PELVIS - 1-2 VIEW

[pelvis ap]
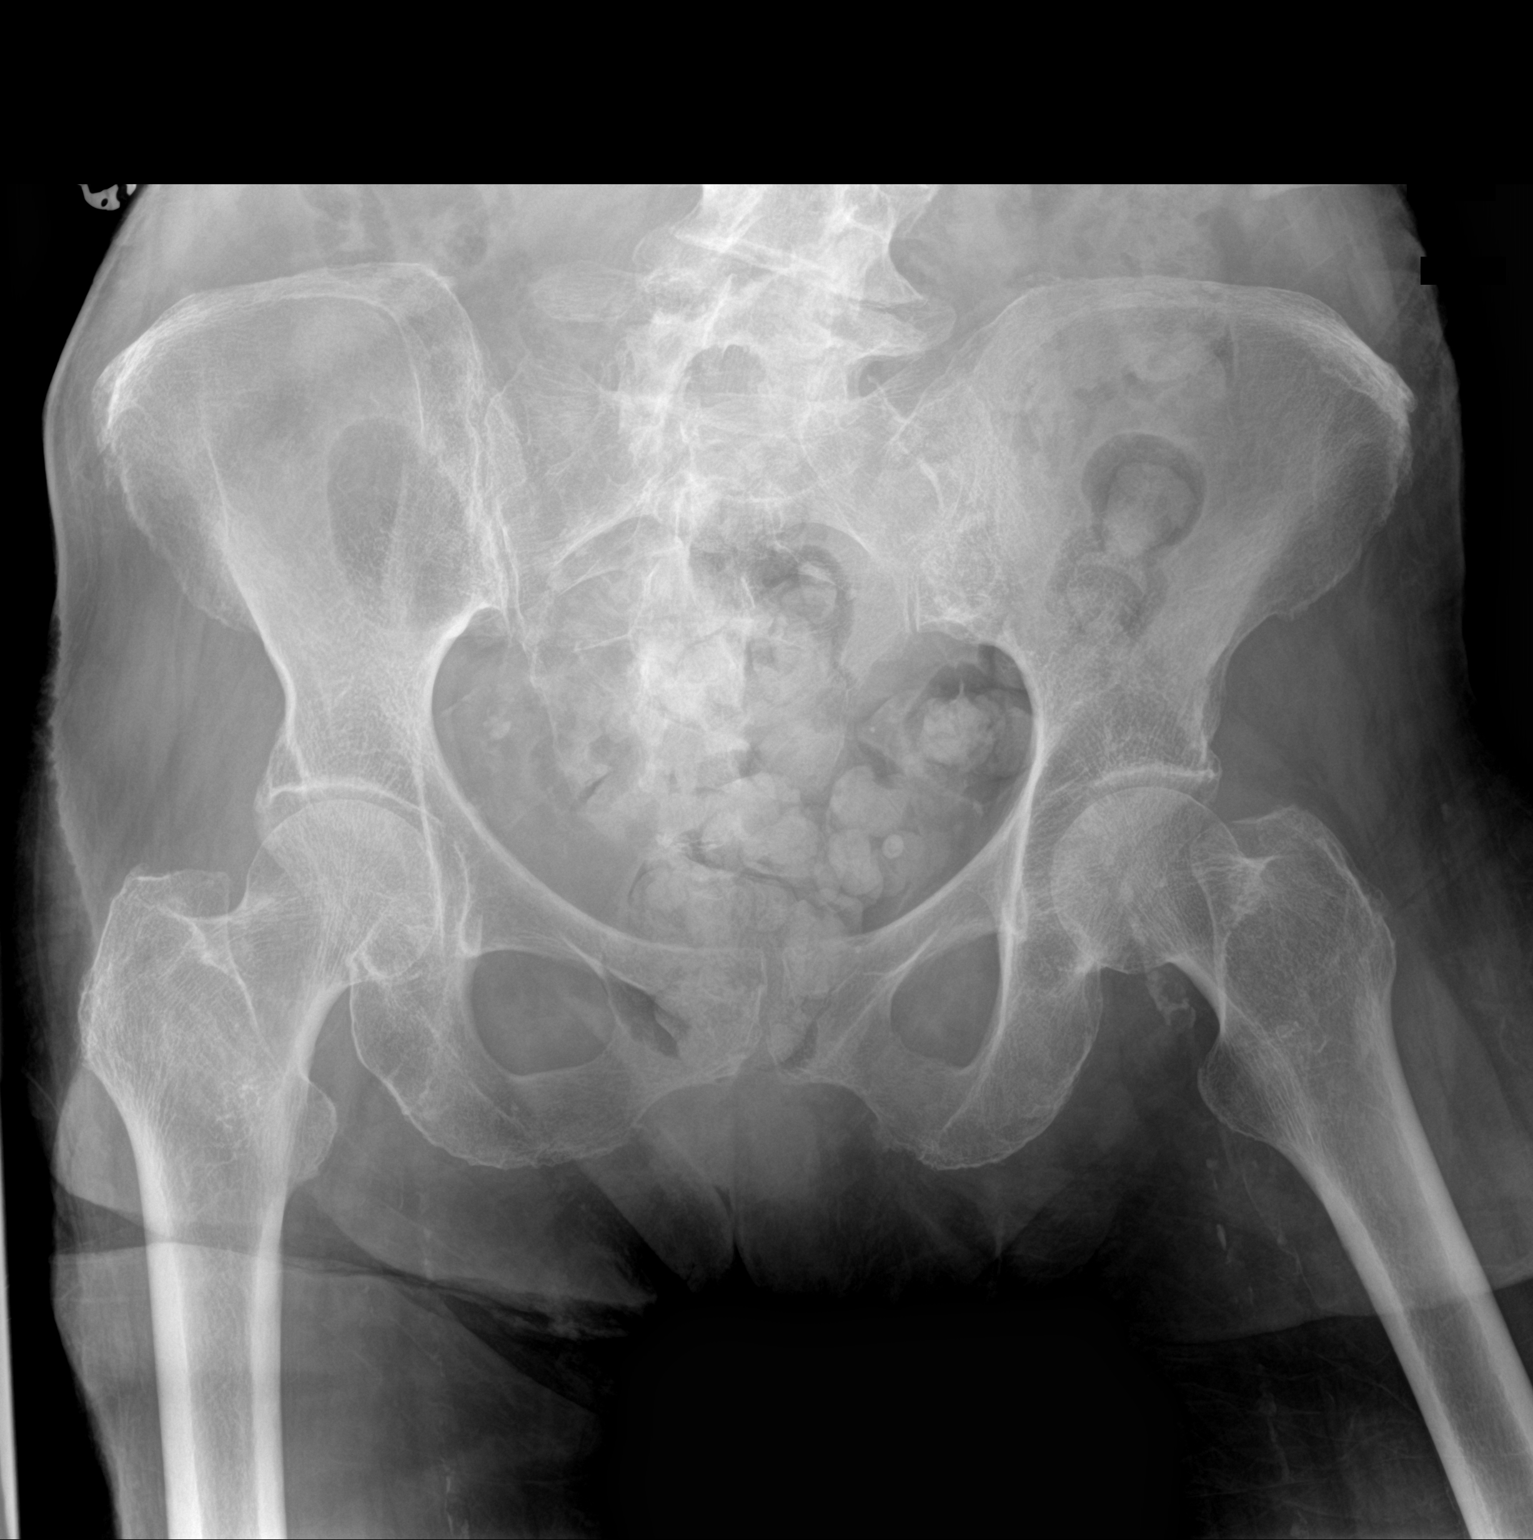

[pelvis lat]
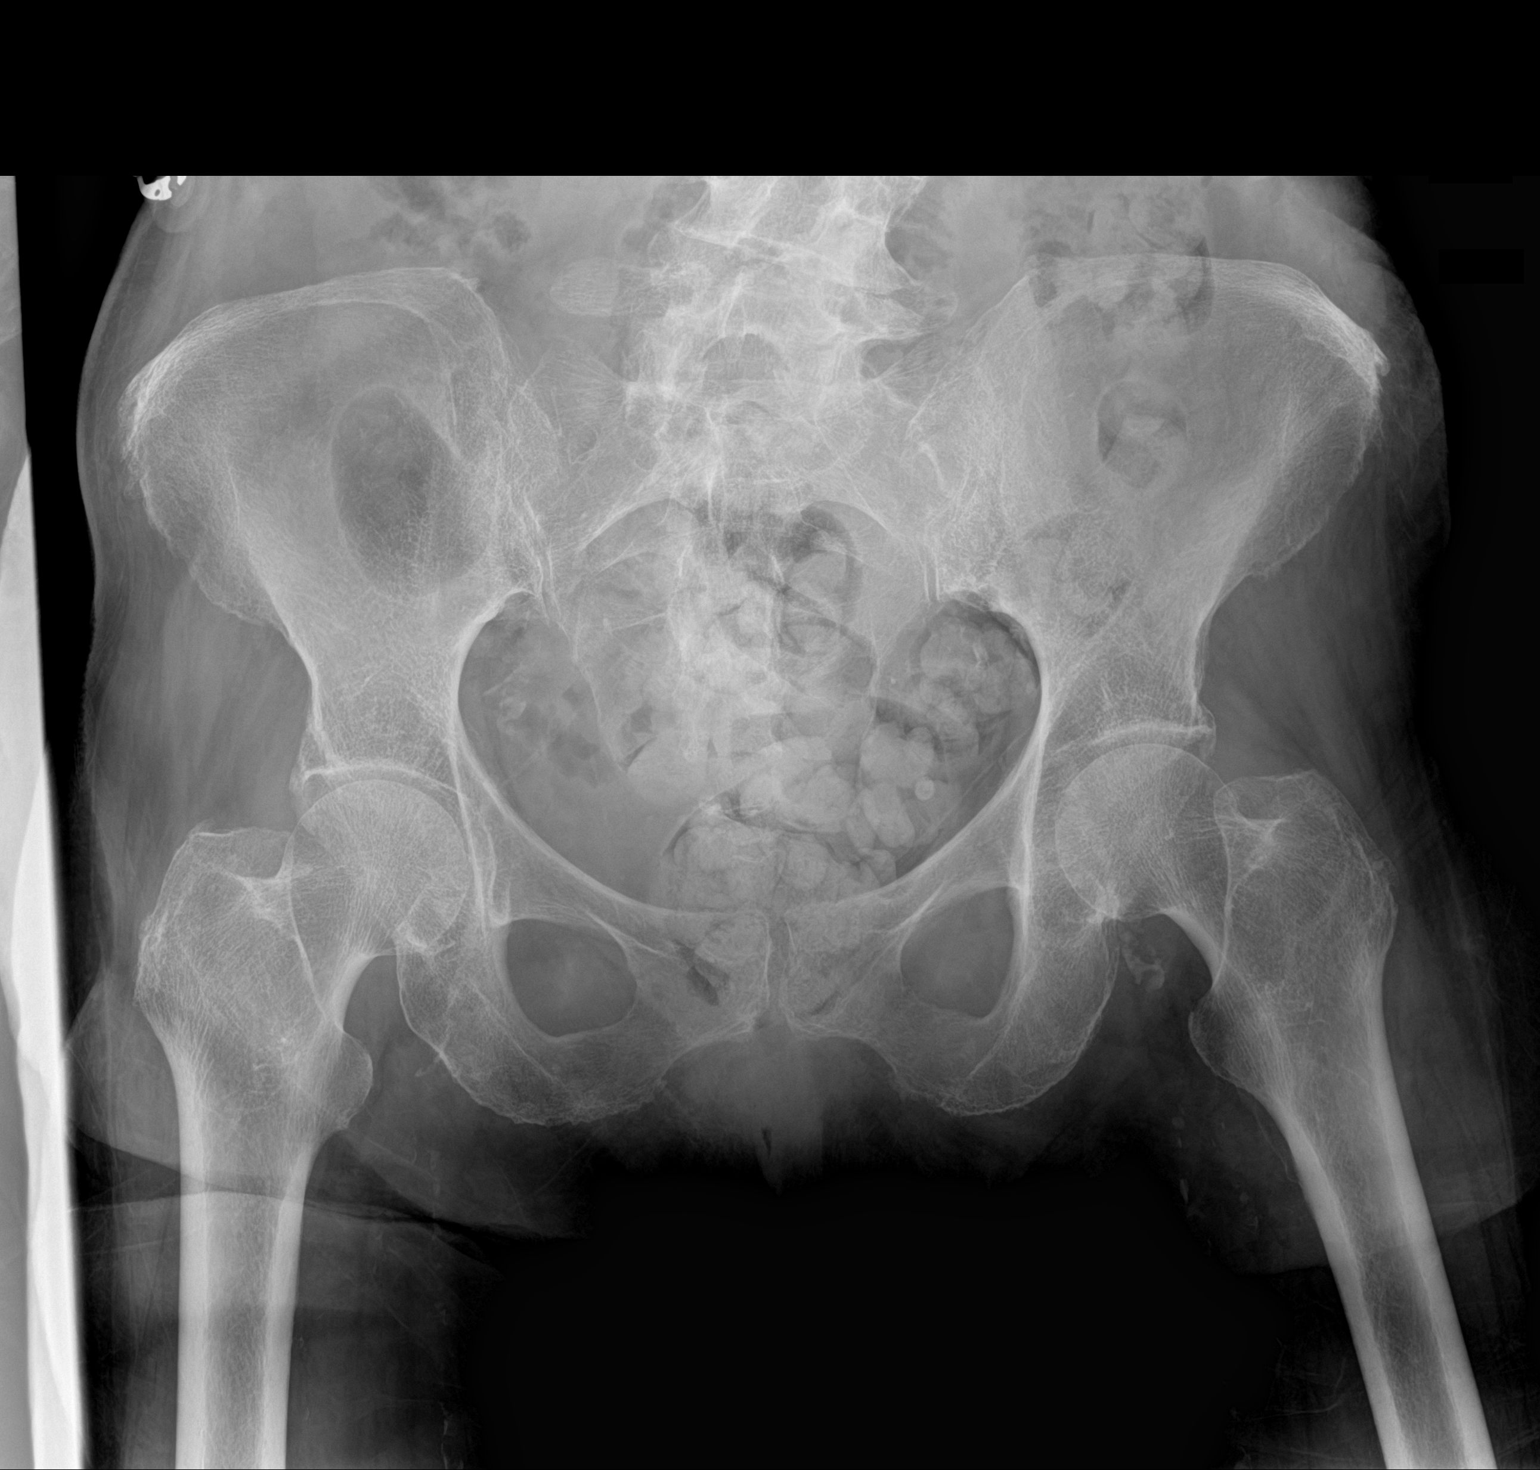

[2 of 2 positions shown; findings below may reference images not displayed]

FINDINGS: Scoliosis and degenerative changes of the lower lumbar spine. Mild
SI joint degenerative changes. Pubic symphysis and rami are intact.
Both femoral heads project in joint. No definite acute displaced
fracture is seen. There are vascular calcifications.
IMPRESSION: Slightly limited evaluation of femoral necks due to superimposition
of the trochanters. No definite acute osseous abnormality is seen

## 2019-03-09 IMAGING — CT CT HEAD W/O CM
4 series · 17 of 47 positions shown, 19 images · non-contrast
Comparison: None.

CLINICAL DATA: Altered mental status.

EXAM:
CT HEAD WITHOUT CONTRAST
TECHNIQUE: Contiguous axial images were obtained from the base of the skull
through the vertex without intravenous contrast.

[Series 3: head wo · axial · 0.42mm/px · z∈[-104,+16]mm · 7 of 32 slices shown, 9 images]
[im 4/32  brain]
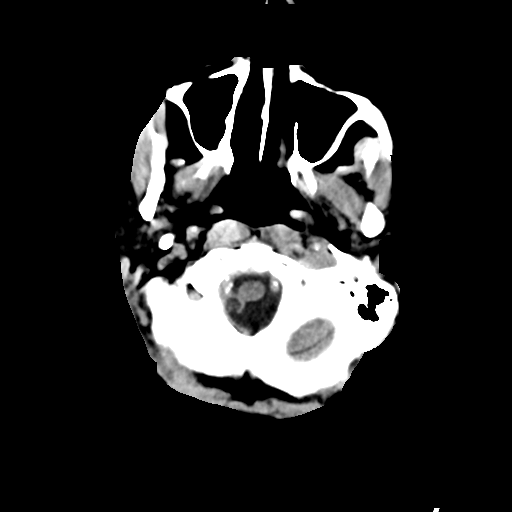
[im 4/32  bone]
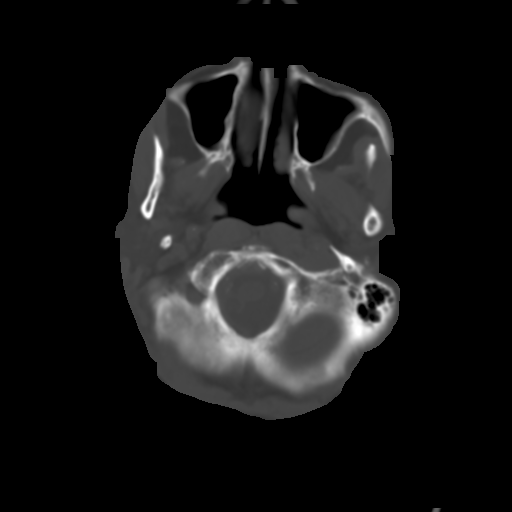
[im 8/32  brain]
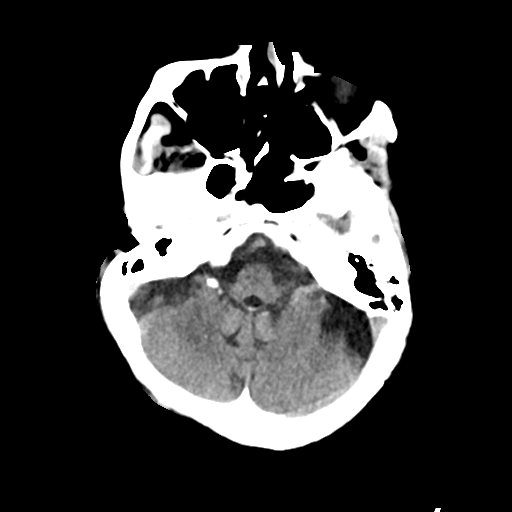
[im 12/32  brain]
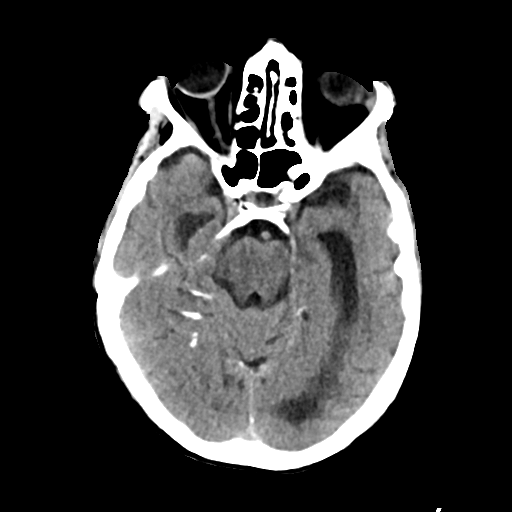
[im 16/32  brain]
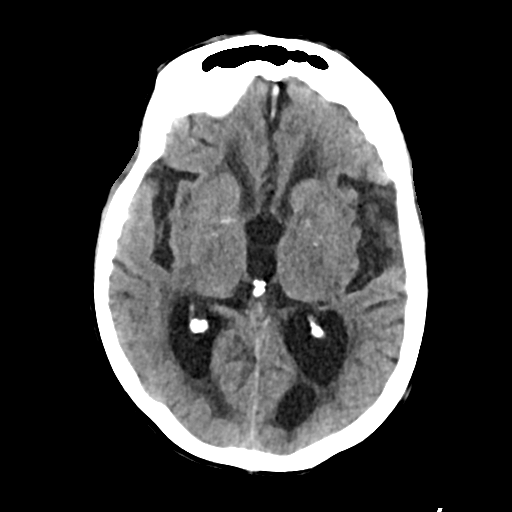
[im 20/32  brain]
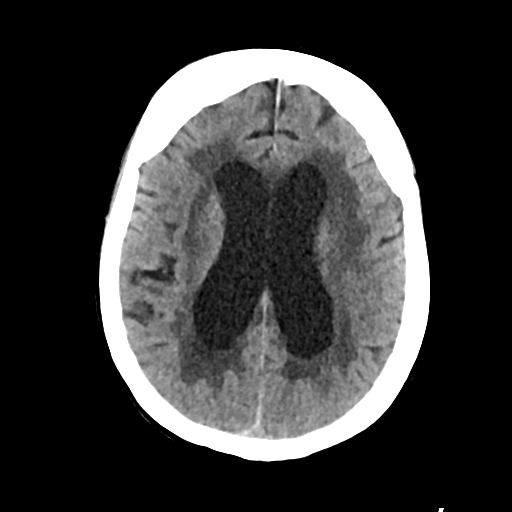
[im 20/32  bone]
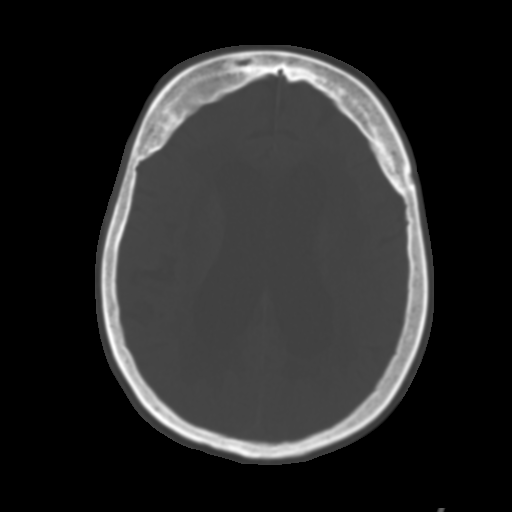
[im 24/32  brain]
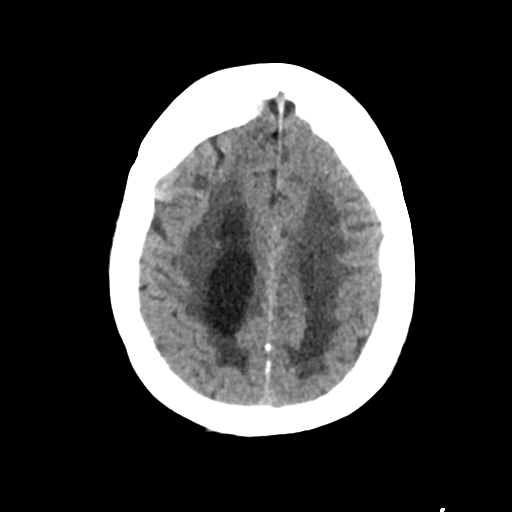
[im 28/32  brain]
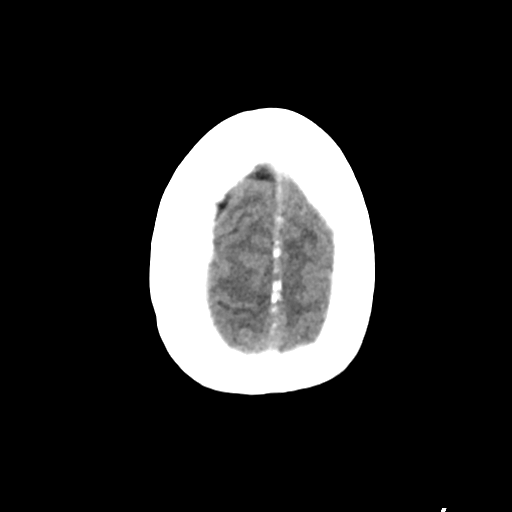

[Series 4: head bone · axial · 0.42mm/px · z∈[-106,-50]mm · 4 of 80 slices shown]
[im 8/80  bone]
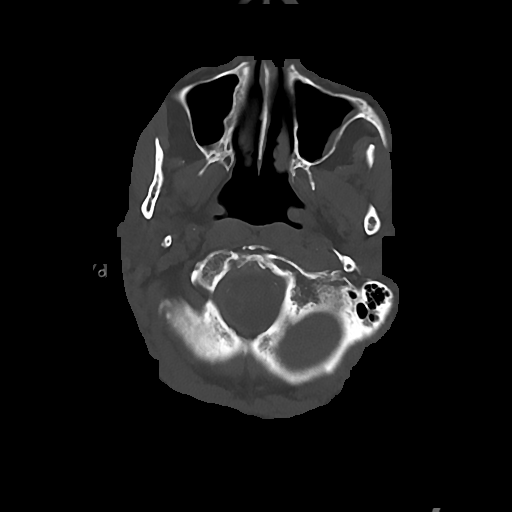
[im 16/80  bone]
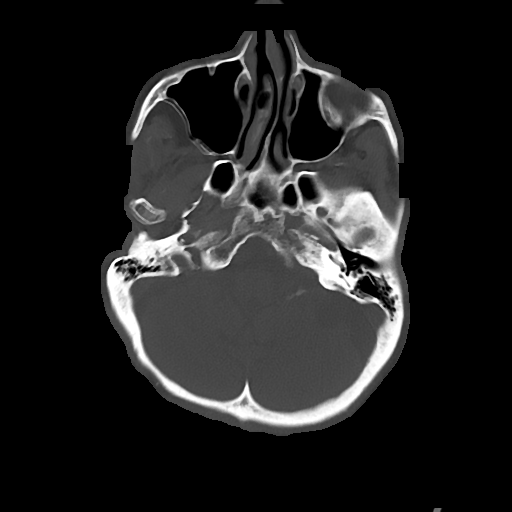
[im 24/80  bone]
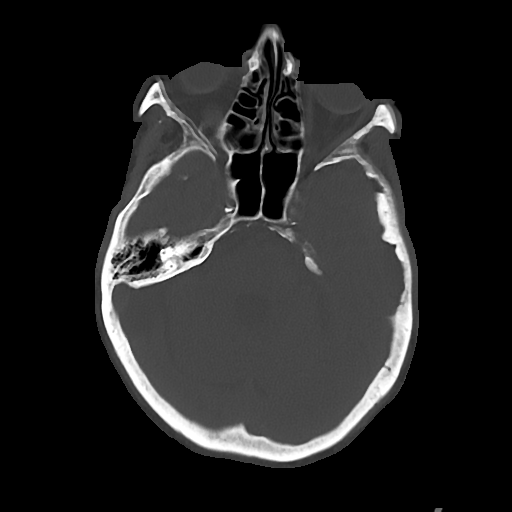
[im 36/80  bone]
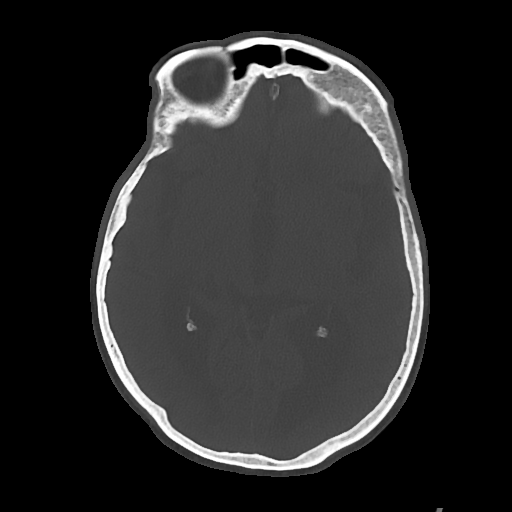

[Series 5: cor soft · coronal · 0.31mm/px · 3 of 67 slices shown]
[im 23/67  brain]
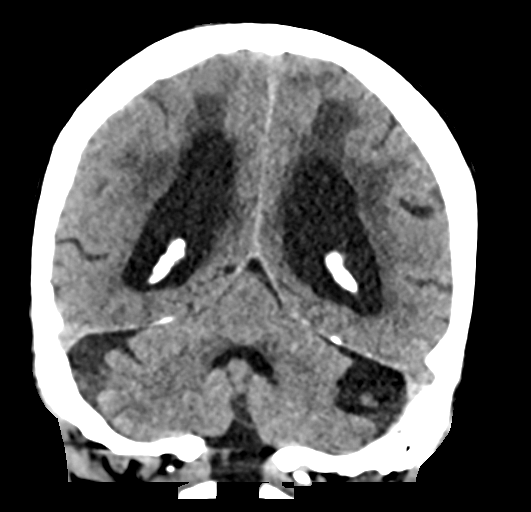
[im 30/67  brain]
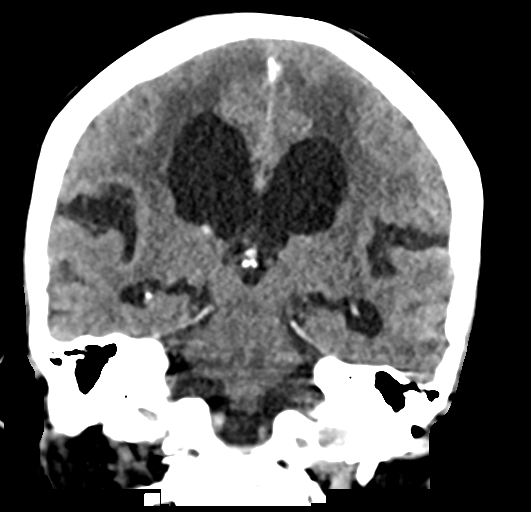
[im 37/67  brain]
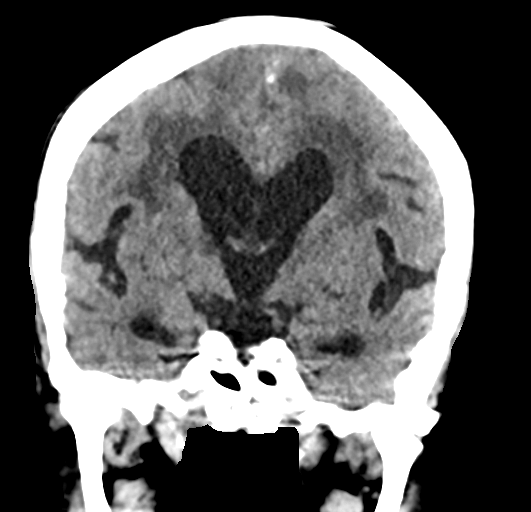

[Series 6: sag soft · sagittal · 0.31mm/px · 3 of 49 slices shown]
[im 17/49  brain]
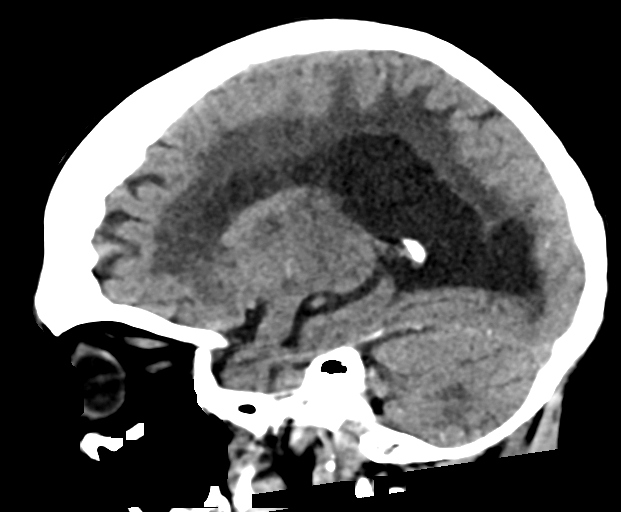
[im 25/49  brain]
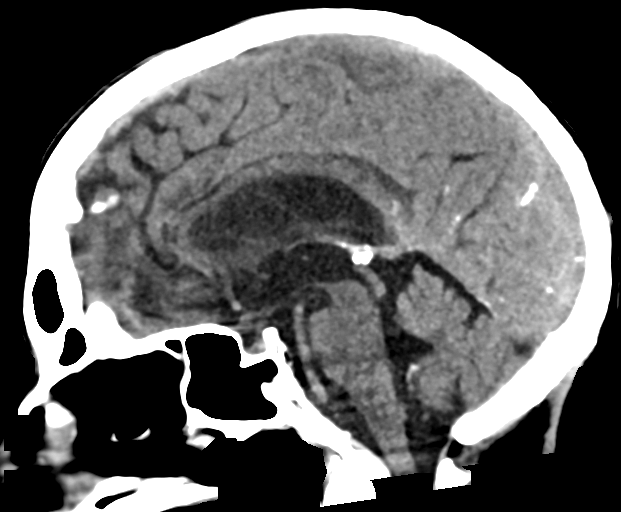
[im 33/49  brain]
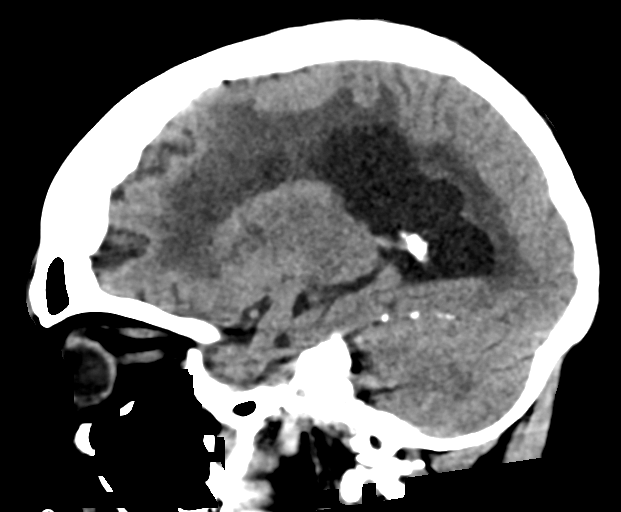

[17 of 47 positions shown; findings below may reference images not displayed]

FINDINGS: Brain: Enlargement of the lateral and third ventricles out of
proportion to the degree of sulcal enlargement. Crowding of the gyri
at the vertex. Prominent periventricular and subcortical white
matter hypodensities are nonspecific. No evidence of acute
infarction, hemorrhage, extra-axial collection or mass lesion/mass
effect.

Vascular: Calcified atherosclerosis at the skullbase. No hyperdense
vessel.

Skull: Negative for fracture or focal lesion.

Sinuses/Orbits: No acute finding.

Other: None.
IMPRESSION: 1. Enlargement of the lateral and third ventricles out of proportion
to the degree of sulcal enlargement/cortical atrophy, suspicious for
normal pressure hydrocephalus.
